# Patient Record
Sex: Female | Born: 1990 | Race: Black or African American | Hispanic: No | Marital: Single | Smoking: Never smoker
Health system: Southern US, Community
[De-identification: ages and names within clinical notes are randomized; demographics above are authoritative.]

## PROBLEM LIST (undated history)

## (undated) DIAGNOSIS — F419 Anxiety disorder, unspecified: Secondary | ICD-10-CM

## (undated) DIAGNOSIS — Z789 Other specified health status: Secondary | ICD-10-CM

## (undated) HISTORY — PX: NO PAST SURGERIES: SHX2092

---

## 2013-10-11 ENCOUNTER — Emergency Department: Payer: Medicaid Other

## 2013-10-11 ENCOUNTER — Emergency Department
Admission: EM | Admit: 2013-10-11 | Discharge: 2013-10-11 | Disposition: A | Discharge status: Home / Self Care | Payer: Medicaid Other | Attending: Emergency Medicine | Admitting: Emergency Medicine

## 2013-10-11 DIAGNOSIS — O9989 Other specified diseases and conditions complicating pregnancy, childbirth and the puerperium: Secondary | ICD-10-CM

## 2013-10-11 DIAGNOSIS — R102 Pelvic and perineal pain: Secondary | ICD-10-CM

## 2013-10-11 DIAGNOSIS — N949 Unspecified condition associated with female genital organs and menstrual cycle: Secondary | ICD-10-CM | POA: Insufficient documentation

## 2013-10-11 DIAGNOSIS — O99891 Other specified diseases and conditions complicating pregnancy: Secondary | ICD-10-CM | POA: Insufficient documentation

## 2013-10-11 LAB — URINALYSIS, REFLEX TO MICROSCOPIC EXAM IF INDICATED
Bilirubin, UA: NEGATIVE
Blood, UA: NEGATIVE
Glucose, UA: NEGATIVE
Ketones UA: 20 — AB
Nitrite, UA: NEGATIVE
Protein, UR: NEGATIVE
Specific Gravity UA: 1.019 (ref 1.001–1.035)
Urine pH: 5 (ref 5.0–8.0)
Urobilinogen, UA: NEGATIVE mg/dL

## 2013-10-11 MED ORDER — ACETAMINOPHEN 325 MG PO TABS
650.0000 mg | ORAL_TABLET | Freq: Once | ORAL | Status: AC
Start: 2013-10-11 — End: 2013-10-11
  Administered 2013-10-11: 650 mg via ORAL
  Filled 2013-10-11: qty 2

## 2013-10-11 NOTE — ED Notes (Signed)
LLQ pain that radiates to her back x 1 week pt is [redacted]weeks pregnant

## 2013-10-11 NOTE — Discharge Instructions (Addendum)
Study results to go  Activity as tolerated  No Advil/Motrin/Ibuprofen  Tylenol - 500 mg every four (4) hours for pain  Fluids-keep hydrated  Return if you are not improving or are worse    ED Medication Orders    None

## 2013-10-16 NOTE — ED Provider Notes (Addendum)
Physician/Midlevel provider first contact with patient: 10/11/13 1637         History     Chief Complaint   Patient presents with   . Abdominal Pain     HPI     Time seen: 1637 - #6    Historian: patient    Chief complaint: abdominal pain in pregnancy    HPI: the pt p/w sharp intermittent abd pain while in 2nd TM of first pregnancy; the pain   is in the LLQ and radiates to the back    Time of onset: one week ago    Severity: moderate at worst    Quality: sharp and stabbing    Improved by: nothing    Worsened by: nothing    Accompanied by: no vaginal bleeding    Complaint experienced before: no    History reviewed. No pertinent past medical history.    History reviewed. No pertinent past surgical history.    History reviewed. No pertinent family history.    Social  History   Substance Use Topics   . Smoking status: Never Smoker    . Smokeless tobacco: Not on file   . Alcohol Use: No   .   No Known Allergies    Discharge Medication List as of 10/11/2013  8:11 PM      CONTINUE these medications which have NOT CHANGED    Details   Prenatal Multivit-Min-Fe-FA (PRE-NATAL FORMULA PO) Take by mouth., Until Discontinued, Historical Med            Review of Systems   Constitutional: Negative for fever, chills and diaphoresis.   Respiratory: Negative for shortness of breath.    Cardiovascular: Negative for chest pain and leg swelling.   Gastrointestinal: Positive for abdominal pain. Negative for nausea, vomiting and diarrhea.   Endocrine: Negative for polydipsia and polyuria.   Genitourinary: Positive for pelvic pain. Negative for dysuria, frequency, hematuria, flank pain, vaginal bleeding and vaginal discharge.   Musculoskeletal: Positive for back pain.   Neurological: Negative for dizziness, syncope, weakness, light-headedness and headaches.   Psychiatric/Behavioral: Negative for confusion and decreased concentration.     Physical Exam    BP: 128/74 mmHg, Heart Rate: 84, Temp: 97.7 F (36.5 C), Resp Rate: 17, SpO2: 100 %,  Weight: 71.668 kg    Physical Exam   Constitutional: She is oriented to person, place, and time. She appears well-developed. No distress.   HENT:   Mouth/Throat: Oropharynx is clear and moist. No oropharyngeal exudate.   Neck: Normal range of motion. Neck supple.   Cardiovascular: Normal rate, regular rhythm and normal heart sounds.    Pulmonary/Chest: Effort normal and breath sounds normal. No respiratory distress. She exhibits no tenderness.   Abdominal: There is no rebound and no guarding.   Gravid abdomen with minimal tenderness in the LLQ   with exam x 2   Musculoskeletal: She exhibits no edema or tenderness.   Neurological: She is alert and oriented to person, place, and time. No cranial nerve deficit. She exhibits normal muscle tone.   Skin: Skin is warm and dry. She is not diaphoretic. No pallor.   Psychiatric: Judgment and thought content normal.   Nursing note and vitals reviewed.    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    Condition at disposition: Stable           Discharge Medication List as of 10/11/2013  8:11 PM            Terri Skains, MD  10/16/13 [phone]

## 2013-11-27 ENCOUNTER — Emergency Department
Admission: EM | Admit: 2013-11-27 | Discharge: 2013-11-27 | Disposition: A | Discharge status: Home / Self Care | Payer: Medicaid Other | Attending: Emergency Medicine | Admitting: Emergency Medicine

## 2013-11-27 ENCOUNTER — Emergency Department: Payer: Medicaid Other

## 2013-11-27 DIAGNOSIS — T148XXA Other injury of unspecified body region, initial encounter: Secondary | ICD-10-CM

## 2013-11-27 DIAGNOSIS — R079 Chest pain, unspecified: Secondary | ICD-10-CM

## 2013-11-27 DIAGNOSIS — O99891 Other specified diseases and conditions complicating pregnancy: Secondary | ICD-10-CM | POA: Insufficient documentation

## 2013-11-27 DIAGNOSIS — IMO0002 Reserved for concepts with insufficient information to code with codable children: Secondary | ICD-10-CM | POA: Insufficient documentation

## 2013-11-27 LAB — CBC AND DIFFERENTIAL
Basophils Absolute Automated: 0.02 10*3/uL (ref 0.00–0.20)
Basophils Automated: 0 %
Eosinophils Absolute Automated: 0.15 10*3/uL (ref 0.00–0.70)
Eosinophils Automated: 2 %
Hematocrit: 35.9 % — ABNORMAL LOW (ref 37.0–47.0)
Hgb: 11.8 g/dL — ABNORMAL LOW (ref 12.0–16.0)
Immature Granulocytes Absolute: 0.09 10*3/uL — ABNORMAL HIGH
Immature Granulocytes: 1 %
Lymphocytes Absolute Automated: 1.76 10*3/uL (ref 0.50–4.40)
Lymphocytes Automated: 18 %
MCH: 30.7 pg (ref 28.0–32.0)
MCHC: 32.9 g/dL (ref 32.0–36.0)
MCV: 93.5 fL (ref 80.0–100.0)
MPV: 11.6 fL (ref 9.4–12.3)
Monocytes Absolute Automated: 0.78 10*3/uL (ref 0.00–1.20)
Monocytes: 8 %
Neutrophils Absolute: 7.13 10*3/uL (ref 1.80–8.10)
Neutrophils: 72 %
Nucleated RBC: 0 /100 WBC (ref 0–1)
Platelets: 196 10*3/uL (ref 140–400)
RBC: 3.84 10*6/uL — ABNORMAL LOW (ref 4.20–5.40)
RDW: 13 % (ref 12–15)
WBC: 9.84 10*3/uL (ref 3.50–10.80)

## 2013-11-27 LAB — URINALYSIS, REFLEX TO MICROSCOPIC EXAM IF INDICATED
Bilirubin, UA: NEGATIVE
Blood, UA: NEGATIVE
Glucose, UA: NEGATIVE
Ketones UA: NEGATIVE
Nitrite, UA: NEGATIVE
Protein, UR: 30 — AB
Specific Gravity UA: 1.005 (ref 1.001–1.035)
Urine pH: 7 (ref 5.0–8.0)
Urobilinogen, UA: NORMAL mg/dL

## 2013-11-27 LAB — COMPREHENSIVE METABOLIC PANEL
ALT: 38 U/L (ref 0–55)
AST (SGOT): 26 U/L (ref 5–34)
Albumin/Globulin Ratio: 0.9 (ref 0.9–2.2)
Albumin: 3 g/dL — ABNORMAL LOW (ref 3.5–5.0)
Alkaline Phosphatase: 60 U/L (ref 37–106)
BUN: 11 mg/dL (ref 7–19)
Bilirubin, Total: 0.2 mg/dL (ref 0.2–1.2)
CO2: 23 mEq/L (ref 22–29)
Calcium: 9 mg/dL (ref 8.5–10.5)
Chloride: 109 mEq/L (ref 100–111)
Creatinine: 0.6 mg/dL (ref 0.6–1.0)
Globulin: 3.3 g/dL (ref 2.0–3.6)
Glucose: 86 mg/dL (ref 70–100)
Potassium: 3.7 mEq/L (ref 3.5–5.1)
Protein, Total: 6.3 g/dL (ref 6.0–8.3)
Sodium: 141 mEq/L (ref 136–145)

## 2013-11-27 LAB — IHS D-DIMER: D-Dimer: 0.98 ug/mL FEU — ABNORMAL HIGH (ref 0.00–0.51)

## 2013-11-27 LAB — GFR: EGFR: >60

## 2013-11-27 MED ORDER — IODIXANOL 320 MG/ML IV SOLN
INTRAVENOUS | Status: AC
Start: 2013-11-27 — End: 2013-11-27
  Administered 2013-11-27: 100 mL via INTRAVENOUS
  Filled 2013-11-27: qty 100

## 2013-11-27 MED ORDER — ACETAMINOPHEN 325 MG PO TABS
650.0000 mg | ORAL_TABLET | ORAL | Status: AC | PRN
Start: 2013-11-27 — End: 2013-12-07

## 2013-11-27 MED ORDER — ACETAMINOPHEN 325 MG PO TABS
650.0000 mg | ORAL_TABLET | Freq: Once | ORAL | Status: AC
Start: 2013-11-27 — End: 2013-11-27
  Administered 2013-11-27: 650 mg via ORAL
  Filled 2013-11-27: qty 2

## 2013-11-27 NOTE — ED Provider Notes (Signed)
Physician/Midlevel provider first contact with patient: 11/27/13 0910         History     Chief Complaint   Patient presents with   . Chest Pain     Patient is a 23 y.o. female presenting with chest pain. The history is provided by the patient and medical records. No language interpreter was used.   Chest Pain  Pain location:  L chest  Pain quality: sharp    Pain radiates to:  Mid back  Pain severity:  Moderate  Onset quality:  Sudden  Duration: last evening at rest.  Timing:  Constant  Progression:  Unchanged  Chronicity:  New  Context: breathing, movement and at rest    Relieved by:  Certain positions and rest  Worsened by:  Movement, deep breathing and certain positions  Ineffective treatments:  None tried  Associated symptoms: back pain and shortness of breath    Associated symptoms: no abdominal pain, no altered mental status, no cough, no diaphoresis, no dizziness, no fatigue, no fever, no lower extremity edema, no nausea, no numbness, no syncope and not vomiting     pt is about 5 months pregnant.  No pmhx or family hx for cad or pe.  Non smoker.     History reviewed. No pertinent past medical history.    History reviewed. No pertinent past surgical history.    History reviewed. No pertinent family history.    Social  History   Substance Use Topics   . Smoking status: Never Smoker    . Smokeless tobacco: Not on file   . Alcohol Use: No       .     No Known Allergies    Discharge Medication List as of 11/27/2013  3:37 PM      CONTINUE these medications which have NOT CHANGED    Details   Prenatal Multivit-Min-Fe-FA (PRE-NATAL FORMULA PO) Take by mouth., Until Discontinued, Historical Med              Review of Systems   Constitutional: Negative for fever, diaphoresis and fatigue.   Respiratory: Positive for shortness of breath. Negative for cough.    Cardiovascular: Positive for chest pain. Negative for syncope.   Gastrointestinal: Negative for nausea, vomiting and abdominal pain.   Musculoskeletal: Positive  for back pain.   Neurological: Negative for dizziness and numbness.       Physical Exam    BP: 125/71 mmHg, Heart Rate: 94, Temp: 97.7 F (36.5 C), Resp Rate: 17, SpO2: 98 %, Weight: 74.844 kg    Physical Exam  Nursing note and vitals reviewed.  Constitutional:  Well developed, well nourished. Awake & Oriented x3.  Head:  Atraumatic. Normocephalic.    Eyes:  PERRL. EOMI. Conjunctivae are not pale.  ENT:  Mucous membranes are moist and intact. Oropharynx is clear and symmetric.  Patent airway.  Neck:  Supple. Full ROM.    Cardiovascular:  Regular rate. Regular rhythm. No murmurs, rubs, or gallops.  Pulmonary/Chest:  No evidence of respiratory distress. Clear to auscultation bilaterally.  No wheezing, rales or rhonchi. Very tender on palpation left chest wall just below breast.  Abdominal:  Soft and non-distended. There is no tenderness. No rebound, guarding, or rigidity.  Back:  Full ROM. Tender left mid lateral left back.  Extremities:  No edema. No cyanosis. No clubbing. Full range of motion in all extremities.  Skin:  Skin is warm and dry.  No diaphoresis. No rash.   Neurological:  Alert, awake,  and appropriate. Normal speech. Motor normal.  Psychiatric:  Good eye contact. Normal interaction, affect, and behavior.    Pox 98 ra normal no tx needed.  MDM and ED Course     ED Medication Orders     Start     Status Ordering Provider    11/27/13 1435  iodixanol (VISIPAQUE) 320 MG/ML injection     Comments:  Created by cabinet override    Last MAR action:  Given     11/27/13 1042  acetaminophen (TYLENOL) tablet 650 mg   Once     Route: Oral  Ordered Dose: 650 mg     Last MAR action:  Given Markey Deady C           MDM  ekg read by me nsr 87.  Normal axis and intervals.  No stt changes.  Normal ekg.    I feel sx likely muscular but given pt pregnant risk factor for PE so will check d-dimer first.  D-dimer was high so ct done normal.    I spoke to Dr. Tenna Delaine who is ok with [facility].     Procedures    Clinical Impression & Disposition     Clinical Impression  Final diagnoses:   Muscle strain        ED Disposition     Discharge Ayauna Vanterpool discharge to home/self care.    Condition at disposition: Stable             Discharge Medication List as of 11/27/2013  3:37 PM      START taking these medications    Details   acetaminophen (TYLENOL) 325 MG tablet Take 2 tablets (650 mg total) by mouth every 4 (four) hours as needed for Pain., Starting 11/27/2013, Until Mon 12/07/13, Print                       Kennith Maes, MD  11/27/13 2041

## 2013-11-27 NOTE — ED Notes (Signed)
pt c/o leftside chest pain under left breast, started yesterday; pt describes pain as sharp and hurts went she takes a deep breath; pt c/o SOB since yesterday; pt states she is 25 weeks pregnan

## 2013-11-27 NOTE — Discharge Instructions (Signed)
Muscle Strain, General    You have been diagnosed with a muscle strain.    Any muscle in the body can be strained. A strain is an injury to muscles where some muscle fibers are injured by being stretched or partly torn. This usually happens from using the muscle too much or from doing an activity the muscle is not used to.    Some strain symptoms are pain, muscle cramping and soreness to the touch.    Often, muscle pain and stiffness are worse the next day. This is much like what happens when someone starts exercising for the first time. After exercising, the person may feel pretty good. However, the next day all the exercised muscles are stiff and sore.    General strain treatment includes:   Resting the affected part.   Pain medicine.   Muscle relaxant medicines.   Warm compresses (such as a warm, moist towel).   Gently stretching the injured muscle.   When tolerated, gently massaging the injured area.    This injury is self-limited (it gets better on its own). It rarely needs specific treatment.    YOU SHOULD SEEK MEDICAL ATTENTION IMMEDIATELY, EITHER HERE OR AT THE NEAREST EMERGENCY DEPARTMENT, IF ANY OF THE FOLLOWING OCCURS:   Major increase in swelling of the affected area.   Pain gets worse instead of gradually improving.   Skin gets red over the affected area.   Unable to use the affected limb. Limb weakness or numbness.              Chest Pain of Unclear Etiology    You have been seen for chest pain. The cause of your pain is not yet known.    Your doctor has learned about your medical history, examined you, and checked any tests that were done. Still, it is unclear why you are having pain. The doctor thinks there is only a very small chance that your pain is caused by a life-threatening condition. Later, your primary care doctor might do more tests or check you again.    Sometimes chest pain is caused by a dangerous condition, like a heart attack, aorta injury, blood clot in the lung,  or collapsed lung. It is unlikely that your pain is caused by a life-threatening condition if: Your chest pain lasts only a few seconds at a time; you are not short of breath, nauseated (sick to your stomach), sweaty, or lightheaded; your pain gets worse when you twist or bend; your pain improves with exercise or hard work.    Chest pain is serious. It is VERY IMPORTANT that you follow up with your regular doctor and seek medical attention immediately here or at the nearest Emergency Department if your symptoms become worse or they change.    YOU SHOULD SEEK MEDICAL ATTENTION IMMEDIATELY, EITHER HERE OR AT THE NEAREST EMERGENCY DEPARTMENT, IF ANY OF THE FOLLOWING OCCURS:   Your pain gets worse.   Your pain makes you short of breath, nauseated, or sweaty.   Your pain gets worse when you walk, go up stairs, or exert yourself.   You feel weak, lightheaded, or faint.   It hurts to breathe.   Your leg swells.   Your symptoms get worse or you have new symptoms or concerns.

## 2013-11-27 NOTE — ED Notes (Signed)
[id number]

## 2013-11-30 LAB — ECG 12-LEAD
Atrial Rate: 87 {beats}/min
P Axis: 35 degrees
P-R Interval: 152 ms
Q-T Interval: 362 ms
QRS Duration: 76 ms
QTC Calculation (Bezet): 435 ms
R Axis: 57 degrees
T Axis: 39 degrees
Ventricular Rate: 87 {beats}/min

## 2014-08-25 ENCOUNTER — Emergency Department: Payer: Self-pay

## 2014-08-25 ENCOUNTER — Emergency Department
Admission: EM | Admit: 2014-08-25 | Discharge: 2014-08-25 | Disposition: A | Discharge status: Home / Self Care | Payer: Self-pay | Attending: Emergency Medicine | Admitting: Emergency Medicine

## 2014-08-25 DIAGNOSIS — R0789 Other chest pain: Secondary | ICD-10-CM | POA: Insufficient documentation

## 2014-08-25 LAB — CBC AND DIFFERENTIAL
Basophils Absolute Automated: 0.02 10*3/uL (ref 0.00–0.20)
Basophils Automated: 0 %
Eosinophils Absolute Automated: 0.13 10*3/uL (ref 0.00–0.70)
Eosinophils Automated: 2 %
Hematocrit: 40.7 % (ref 37.0–47.0)
Hgb: 13.5 g/dL (ref 12.0–16.0)
Lymphocytes Absolute Automated: 1.87 10*3/uL (ref 0.50–4.40)
Lymphocytes Automated: 26 %
MCH: 31 pg (ref 28.0–32.0)
MCHC: 33.2 g/dL (ref 32.0–36.0)
MCV: 93.3 fL (ref 80.0–100.0)
MPV: 11.7 fL (ref 9.4–12.3)
Monocytes Absolute Automated: 0.56 10*3/uL (ref 0.00–1.20)
Monocytes: 8 %
Neutrophils Absolute: 4.69 10*3/uL (ref 1.80–8.10)
Neutrophils: 64 %
Platelets: 243 10*3/uL (ref 140–400)
RBC: 4.36 10*6/uL (ref 4.20–5.40)
RDW: 11 % — ABNORMAL LOW (ref 12–15)
WBC: 7.27 10*3/uL (ref 3.50–10.80)

## 2014-08-25 LAB — BASIC METABOLIC PANEL
Anion Gap: 10 (ref 5.0–15.0)
BUN: 12 mg/dL (ref 7.0–19.0)
CO2: 28 mEq/L (ref 22–29)
Calcium: 9.9 mg/dL (ref 8.5–10.5)
Chloride: 104 mEq/L (ref 100–111)
Creatinine: 0.8 mg/dL (ref 0.6–1.0)
Glucose: 97 mg/dL (ref 70–100)
Potassium: 3.8 mEq/L (ref 3.5–5.1)
Sodium: 142 mEq/L (ref 136–145)

## 2014-08-25 LAB — GFR: EGFR: >60

## 2014-08-25 LAB — IHS D-DIMER: D-Dimer: 0.3 ug/mL FEU (ref 0.00–0.51)

## 2014-08-25 MED ORDER — KETOROLAC TROMETHAMINE 30 MG/ML IJ SOLN
30.0000 mg | Freq: Once | INTRAMUSCULAR | Status: AC
Start: 2014-08-25 — End: 2014-08-25
  Administered 2014-08-25: 30 mg via INTRAVENOUS
  Filled 2014-08-25: qty 2

## 2014-08-25 MED ORDER — IBUPROFEN 600 MG PO TABS
600.0000 mg | ORAL_TABLET | Freq: Four times a day (QID) | ORAL | Status: DC | PRN
Start: 2014-08-25 — End: 2014-11-01

## 2014-08-25 MED ORDER — SODIUM CHLORIDE 0.9 % IV BOLUS
1000.0000 mL | Freq: Once | INTRAVENOUS | Status: AC
Start: 2014-08-25 — End: 2014-08-25
  Administered 2014-08-25: 1000 mL via INTRAVENOUS

## 2014-08-25 NOTE — ED Provider Notes (Signed)
Physician/Midlevel provider first contact with patient: 08/25/14 1927           EMERGENCY DEPARTMENT NOTE    Physician/Midlevel provider first contact with patient: 08/25/14 1927         HISTORY OF PRESENT ILLNESS   Historian: Patient  Translator Used: None    24 y.o. female presents with cp for 1 week with mild dyspnea at night. Denies cough, congestion, f/c, abdominal pain. Denies FH of CAD at a young age. No known PMH including asthma. Patient is s/p vaginal delivery in Nov. No LE edema, prior pe/dvt    1. Location of symptoms: Chest   2. Onset of symptoms: 1 week   3. What was patient doing when symptoms started (Context): At rest   4. Severity: Mild  5. Timing: Intermittent  6. Activities that worsen symptoms: [place]  7. Activities that improve symptoms: Nothing taken   8. Quality: Ache    9. Radiation of symptoms: None   10. Associated signs and Symptoms: None    11. Are symptoms worsening? Unchanged          MEDICAL HISTORY     Past Medical History:  History reviewed. No pertinent past medical history.    Past Surgical History:  History reviewed. No pertinent past surgical history.    Social History:  History     Social History   . Marital Status: Single     Spouse Name: N/A   . Number of Children: N/A   . Years of Education: N/A     Occupational History   . Not on file.     Social History Main Topics   . Smoking status: Never Smoker    . Smokeless tobacco: Not on file   . Alcohol Use: No   . Drug Use: No   . Sexual Activity: Not on file     Other Topics Concern   . Not on file     Social History Narrative       Family History:  History reviewed. No pertinent family history.    Outpatient Medication:  Discharge Medication List as of 08/25/2014  8:54 PM      CONTINUE these medications which have NOT CHANGED    Details   Prenatal Multivit-Min-Fe-FA (PRE-NATAL FORMULA PO) Take by mouth., Until Discontinued, Historical Med             Allergies:  No Known Allergies      REVIEW OF SYSTEMS   Review of Systems    Constitutional: Negative for fever and chills.   Respiratory: Negative for cough and wheezing.    Cardiovascular: Positive for chest pain.   All other systems reviewed and are negative.        PHYSICAL EXAM     Filed Vitals:    08/25/14 1925 08/25/14 1943 08/25/14 2042 08/25/14 2103   BP:  118/75 113/84 108/63   Pulse:  80 74 74   Resp:  18 18 20    Height: 5\' 10"  (1.778 m)      Weight: 77.111 kg      SpO2:  98% 98% 98%       Nursing note and vitals reviewed.  Constitutional:  Well developed, well nourished. Awake & Oriented x3.  Head:  Atraumatic. Normocephalic.    Eyes:  PERRL. EOMI. Conjunctivae are not pale.  ENT:  Mucous membranes are moist and intact. Oropharynx is clear and symmetric.  Patent airway.  Neck:  Supple. Full ROM.    Cardiovascular:  Regular rate. Regular rhythm. No murmurs, rubs, or gallops.  Pulmonary/Chest:  No evidence of respiratory distress. Clear to auscultation bilaterally.  No wheezing, rales or rhonchi. Nontender anterior chest wall  Abdominal:  Soft and non-distended. There is no tenderness. No rebound, guarding, or rigidity.  Extremities:  No edema. No cyanosis. No clubbing. Full range of motion in all extremities.  Skin:  Skin is warm and dry.  No diaphoresis. No rash.   Neurological:  Alert, awake, and appropriate. Normal speech. Motor normal.  Psychiatric:  Good eye contact. Normal interaction, affect, and behavior.        MEDICAL DECISION MAKING       EKG is without acute changes. No arrhythmia seen on ekg/cardaic monitor. CXR negative  Dimer negative, no le edema. Pt is a TIMI 0 without cardiac risk factors or FH of CAD at a young age  Likely atypical pain. Advised to follow up with her primary care provider    DISCUSSION      Vital Signs: Reviewed the patient?s vital signs.   Nursing Notes: Reviewed and utilized available nursing notes.  Medical Records Reviewed: Reviewed available past medical records.  Counseling: The emergency provider has spoken with the patient and  discussed today?s findings, in addition to providing specific details for the plan of care.  Questions are answered and there is agreement with the plan.    IMAGING STUDIES    The following imaging studies were independently interpreted by the Emergency Medicine Physician.  For full imaging study results please see chart.        Imaging Results          Chest 2 Views (Final result)     Result time: 08/25/14 20:05:33     Final result by Rachelle Hora, MD (08/25/14 20:05:33)    Impression:       NO SIGNIFICANT ABNORMALITY.    Darnelle Maffucci, MD   08/25/2014 8:05 PM       Narrative:     XR CHEST 2 VIEWS    CLINICAL INDICATION: Chest pain     COMPARISON: None    TECHNIQUE: The following radiographs were obtained per protocol:  XR  CHEST 2 VIEWS    FINDINGS: Cardiomediastinal silhouette is within normal limits. No  alveolar infiltrate. No effusion. No active disease. The osseous  structures are unremarkable. No acute abnormalities are apparent.           CARDIAC STUDIES     The following cardiac studies were independently interpreted by the Emergency Medicine Physician. For full cardiac study results please see chart     Monitor Strip   Interpreted by ED Physician   Rate: 84  Rhythm: SR  ST Changes: None    EKG Interpretation:   Signed and interpreted by ED Physician   Time Interpreted: 1926  Comparison: 11/2013  Rate: 83  Rhythm: SR  Axis: Normal  Intervals: Normal   Blocks: None  ST segments: No acute changes.   Interpretation: Normal EKG      PULSE OXIMETRY    Oxygen Saturation by Pulse Oximetry: 98%  Interventions:   Interpretation: Normal    EMERGENCY DEPT. MEDICATIONS      ED Medication Orders     Start Ordered     Status Ordering Provider    08/25/14 2030 08/25/14 2029  sodium chloride 0.9 % bolus 1,000 mL   Once     Route: Intravenous  Ordered Dose: 1,000 mL     Last MAR action:  Stopped Raliegh Ip    08/25/14 2030 08/25/14 2029  ketorolac (TORADOL) injection 30 mg    Once     Route: Intravenous  Ordered Dose: 30 mg     Last MAR action:  Given Macarthur Lorusso YVONNE          LABORATORY RESULTS    Ordered and independently interpreted AVAILABLE laboratory tests. Please see results section in chart for full details.  Results for orders placed or performed during the hospital encounter of 08/25/14   CBC with Differential   Result Value Ref Range    WBC 7.27 3.50 - 10.80 x10 3/uL    Hgb 13.5 12.0 - 16.0 g/dL    Hematocrit 72.5 36.6 - 47.0 %    Platelets 243 140 - 400 x10 3/uL    RBC 4.36 4.20 - 5.40 x10 6/uL    MCV 93.3 80.0 - 100.0 fL    MCH 31.0 28.0 - 32.0 pg    MCHC 33.2 32.0 - 36.0 g/dL    RDW 11 (L) 12 - 15 %    MPV 11.7 9.4 - 12.3 fL    Neutrophils 64 None %    Lymphocytes Automated 26 None %    Monocytes 8 None %    Eosinophils Automated 2 None %    Basophils Automated 0 None %    Immature Granulocyte Unmeasured None %    Nucleated RBC Unmeasured 0 - 1 /100 WBC    Neutrophils Absolute 4.69 1.80 - 8.10 x10 3/uL    Abs Lymph Automated 1.87 0.50 - 4.40 x10 3/uL    Abs Mono Automated 0.56 0.00 - 1.20 x10 3/uL    Abs Eos Automated 0.13 0.00 - 0.70 x10 3/uL    Absolute Baso Automated 0.02 0.00 - 0.20 x10 3/uL    Absolute Immature Granulocyte Unmeasured 0 x10 3/uL   Basic Metabolic Panel (BMP)   Result Value Ref Range    Glucose 97 70 - 100 mg/dL    BUN 44.0 7.0 - 34.7 mg/dL    Creatinine 0.8 0.6 - 1.0 mg/dL    CALCIUM 9.9 8.5 - 42.5 mg/dL    Sodium 956 387 - 564 mEq/L    Potassium 3.8 3.5 - 5.1 mEq/L    Chloride 104 100 - 111 mEq/L    CO2 28 22 - 29 mEq/L    Anion Gap 10.0 5.0 - 15.0   D-Dimer   Result Value Ref Range    D-Dimer 0.30 0.00 - 0.51 ug/mL FEU   GFR   Result Value Ref Range    EGFR >60.0    ECG 12 lead   Result Value Ref Range    Ventricular Rate 83 BPM    Atrial Rate 83 BPM    P-R Interval 156 ms    QRS Duration 86 ms    Q-T Interval 352 ms    QTC Calculation (Bezet) 413 ms    P Axis 34 degrees    R Axis 68 degrees    T Axis 54 degrees       CONSULTATIONS         CRITICAL CARE        ATTESTATIONS        Physician Attestation: I, Dr Marnee Spring DO, have been the primary provider for Carlyon Prows during this Emergency Dept visit and have reviewed the chart for accuracy and agree with its content.       DIAGNOSIS      Diagnosis:  Final diagnoses:  Atypical chest pain       Disposition:  ED Disposition     Discharge Simrit Lukens discharge to home/self care.    Condition at disposition: Stable            Prescriptions:  Discharge Medication List as of 08/25/2014  8:54 PM      START taking these medications    Details   ibuprofen (ADVIL,MOTRIN) 600 MG tablet Take 1 tablet (600 mg total) by mouth every 6 (six) hours as needed for Pain or Fever., Starting 08/25/2014, Until Discontinued, Print         CONTINUE these medications which have NOT CHANGED    Details   Prenatal Multivit-Min-Fe-FA (PRE-NATAL FORMULA PO) Take by mouth., Until Discontinued, Historical Med               Raliegh Ip, DO  08/26/14 [phone]

## 2014-08-25 NOTE — ED Notes (Signed)
Chest pain onset 3 days ago, sharp and constant + nausea, denies vomiting + sob, pain is non- radiating. Denies respiratory sx

## 2014-08-25 NOTE — Discharge Instructions (Signed)
Musculoskeletal Chest Pain    You have been diagnosed with musculoskeletal chest pain.    Your pain is due to an injury or inflammation (swelling) of the muscles, ligaments, cartilage (soft bone), or bone in your chest. The pain is usually sharp and knife-like and becomes worse with twisting, bending, or moving. It commonly occurs in a small area, and can be irritated by pressing on it. There is usually no shortness of breath, lightheadedness, weakness, or sweaty feeling. Some children will have pain when taking a deep breath or when coughing. Exercise usually does not affect these symptoms.    Musculoskeletal chest pain is treated with anti-inflammatory medications like ibuprofen (Advil or Motrin) or naproxen (Aleve). Other pain medications are usually not needed. Depending on the reason for your symptoms, either warm or cool compresses (damp washcloths laid on the skin) may be helpful.    Most musculoskeletal chest pain improves over several days.    You do not need to follow up with a doctor unless your symptoms get worse or fail to improve in the next few days.    YOU SHOULD SEEK MEDICAL ATTENTION IMMEDIATELY, EITHER HERE OR AT THE NEAREST EMERGENCY DEPARTMENT, IF ANY OF THE FOLLOWING OCCURS:   Your pain gets worse.   Your pain makes you feel short of breath, nauseated, or sweaty.   You notice that your pain gets worse as you walk, go up stairs, or exert yourself.   You have any weakness or lightheadedness with your pain.   Your pain makes breathing difficult.   You develop a swollen leg.   Your symptoms get worse or you have other concerns.

## 2014-08-27 LAB — ECG 12-LEAD
Atrial Rate: 83 {beats}/min
P Axis: 34 degrees
P-R Interval: 156 ms
Q-T Interval: 352 ms
QRS Duration: 86 ms
QTC Calculation (Bezet): 413 ms
R Axis: 68 degrees
T Axis: 54 degrees
Ventricular Rate: 83 {beats}/min

## 2014-10-08 ENCOUNTER — Emergency Department: Payer: Self-pay

## 2014-10-08 ENCOUNTER — Emergency Department
Admission: EM | Admit: 2014-10-08 | Discharge: 2014-10-08 | Disposition: A | Discharge status: Home / Self Care | Payer: Self-pay | Attending: Student in an Organized Health Care Education/Training Program | Admitting: Student in an Organized Health Care Education/Training Program

## 2014-10-08 DIAGNOSIS — K029 Dental caries, unspecified: Secondary | ICD-10-CM | POA: Insufficient documentation

## 2014-10-08 DIAGNOSIS — K0889 Other specified disorders of teeth and supporting structures: Secondary | ICD-10-CM

## 2014-10-08 MED ORDER — PENICILLIN V POTASSIUM 250 MG PO TABS
500.0000 mg | ORAL_TABLET | Freq: Once | ORAL | Status: AC
Start: 2014-10-08 — End: 2014-10-08
  Administered 2014-10-08: 500 mg via ORAL
  Filled 2014-10-08: qty 2

## 2014-10-08 MED ORDER — OXYCODONE HCL 5 MG PO TABS
ORAL_TABLET | ORAL | Status: DC
Start: 2014-10-08 — End: 2014-11-01

## 2014-10-08 MED ORDER — PENICILLIN V POTASSIUM 500 MG PO TABS
500.0000 mg | ORAL_TABLET | Freq: Four times a day (QID) | ORAL | Status: AC
Start: 2014-10-08 — End: 2014-10-15

## 2014-10-08 MED ORDER — OXYCODONE HCL 5 MG PO TABS
5.0000 mg | ORAL_TABLET | Freq: Once | ORAL | Status: AC
Start: 2014-10-08 — End: 2014-10-08
  Administered 2014-10-08: 5 mg via ORAL
  Filled 2014-10-08: qty 1

## 2014-10-08 NOTE — Discharge Instructions (Signed)
Please continue to closely monitor your symptoms  Take motrin/tylenol as directed as needed for pain  Take keflex as directed  Take oxycodone as directed as needed for severe breakthrough pain. No driving/heavy machinery while on oxycodone. Please pump and dump your breast milk while taking oxycodone since it can potentially be harmful to your baby  Return to the ER if worsening pain/swelling/difficulty breathing/fever/chills or any other worsening concerns  Please follow up with dentist    Thank you for allowing [place] to address your concerns. Hope you feel better soon!  Sharon Seller M.D.  Emergency Medicine    Dental Caries    You have been seen for dental caries or cavities.    Dental caries are common. They are caused by loss of tooth enamel and decay of the tooth.    You may feel pain when drinking hot liquids, eating sweets, or chewing.    You will be provided with pain medication. Later, you should follow up with a dentist. To help prevent more dental caries, it is important to practice good dental care. This includes brushing your teeth often, using dental floss, and seeing your dentist regularly.    It is VERY IMPORTANT to follow up with a dentist. We recommend you see a dentist within 2 to 3 days.    YOU SHOULD BE SEEN BY A DENTIST, OR RETURN HERE OR GO TO THE NEAREST EMERGENCY DEPARTMENT IMMEDIATELY IF ANY OF THE FOLLOWING OCCURS:   Your pain gets worse.   You develop swelling of your face, under your tongue, or in your throat.   You develop fever (temperature higher than 100.44F / 38C) with dental pain.    Emergency and Urgent Care physicians are not dentists. Emergency department or urgent care treatment IS NOT A SUBSTITUTE for treatment by a dentist. You need to follow up with your dentist immediately and arrange to see a dentist on a regular basis.

## 2014-10-08 NOTE — ED Notes (Signed)
Ambulatory alert orientedx4 c/o of toothache for 2 weeks.

## 2014-10-08 NOTE — ED Provider Notes (Signed)
EMERGENCY DEPARTMENT NOTE    Physician/Midlevel provider first contact with patient: 10/08/14 0318         HISTORY OF PRESENT ILLNESS   Historian:Patient  Translator Used: No    Chief Complaint: Dental Pain      24 y.o. female p/w dental pain. Pt reports pain on lower and upper R molars x ~2 weeks. Pain worse on upper molar. No fever/chills/hearing loss/tinnitus/lip or tongue swelling/SOB/facial swelling      MEDICAL HISTORY     Past Medical History:  History reviewed. No pertinent past medical history.    Past Surgical History:  History reviewed. No pertinent past surgical history.    Social History:  History     Social History   . Marital Status: Single     Spouse Name: N/A   . Number of Children: N/A   . Years of Education: N/A     Occupational History   . Not on file.     Social History Main Topics   . Smoking status: Never Smoker    . Smokeless tobacco: Not on file   . Alcohol Use: No   . Drug Use: No   . Sexual Activity: Not on file     Other Topics Concern   . Not on file     Social History Narrative       Family History:  History reviewed. No pertinent family history.    Outpatient Medication:  Discharge Medication List as of 10/08/2014  3:32 AM      CONTINUE these medications which have NOT CHANGED    Details   acetaminophen (TYLENOL) 500 MG tablet Take 1,000 mg by mouth.   , Until Discontinued, Historical Med      ibuprofen (ADVIL,MOTRIN) 600 MG tablet Take 1 tablet (600 mg total) by mouth every 6 (six) hours as needed for Pain or Fever., Starting 08/25/2014, Until Discontinued, Print               REVIEW OF SYSTEMS     Review of Systems: Review of system otherwise Negative except for noted below;  CONSTITUTIONAL: No fever/chills  HEENT: toothache          PHYSICAL EXAM   BP 137/86 mmHg  Pulse 70  Temp(Src) 98.4 F (36.9 C) (Oral)  Resp 18  Ht 5\' 7"  (1.702 m)  Wt 78.019 kg  BMI 26.93 kg/m2  SpO2 99%  LMP 10/03/2014  Physical Exam:  GEN: NAD, appears well  HEENT: Dental carries, +TTP tooth #2-3. No  drainable abscess. No drainage/TTP parotids. TM clear b/l. No TTP mastoids. No lip or tongue swelling. No tonsillar erythema/edema/exudate. Midline uvula. MMM. No halitosis  NECK: soft/supple  NEURO: CN II-XII intactrash        MEDICAL DECISION MAKING       Vital Signs: Reviewed the patient?s vital signs.   Nursing Notes: Reviewed and utilized available nursing notes.  Medical Records Reviewed: Reviewed available past medical records.  Counseling: The emergency provider has spoken with the patient and discussed today?s findings, in addition to providing specific details for the plan of care.  Questions are answered and there is agreement with the plan.            PULSE OXIMETRY    Oxygen Saturation by Pulse Oximetry: 99%  Interventions: none  Interpretation:  normal    EMERGENCY DEPT. MEDICATIONS      ED Medication Orders     Start Ordered     Status Ordering Provider  10/08/14 0330 10/08/14 0329  oxyCODONE (ROXICODONE) immediate release tablet 5 mg   Once     Route: Oral  Ordered Dose: 5 mg     Last MAR action:  Given Kary Sugrue    10/08/14 0330 10/08/14 0329  penicillin v potassium (VEETID) tablet 500 mg   Once     Route: Oral  Ordered Dose: 500 mg     Last MAR action:  Given Johnathin Vanderschaaf                DISCUSSION/ED COURSE    24 y.o. female with dental caries/pain as noted above. Pt overall appearing well. Will tx with PCN VK, motrin/tylelenol, oxycodone PRN severe breakthrough pain. Pt states she will pump and dump breast milk to be completely safe.   The pt is stable for discharge, counseling is provided as documented below, discussed symptomatic treatment and specific conditions for return.  Pt will closely monitor symptoms and followup. Will return immediately is any worsening symptoms/concerns.       DIAGNOSIS      Diagnosis:  Final diagnoses:   Dental caries   Pain, dental       Disposition:  ED Disposition     Discharge Brenda Rothgeb discharge to home/self care.    Condition at disposition:  Stable            Prescriptions:  Discharge Medication List as of 10/08/2014  3:32 AM      START taking these medications    Details   oxyCODONE (ROXICODONE) 5 MG immediate release tablet 1-2 tablets every 6-8 hours as needed for pain, Print      penicillin v potassium (VEETID) 500 MG tablet Take 1 tablet (500 mg total) by mouth 4 (four) times daily., Starting 10/08/2014, Until Fri 10/15/14, Print         CONTINUE these medications which have NOT CHANGED    Details   acetaminophen (TYLENOL) 500 MG tablet Take 1,000 mg by mouth.   , Until Discontinued, Historical Med      ibuprofen (ADVIL,MOTRIN) 600 MG tablet Take 1 tablet (600 mg total) by mouth every 6 (six) hours as needed for Pain or Fever., Starting 08/25/2014, Until Discontinued, Print                       Sharon Seller, MD  10/10/14 2155

## 2014-11-01 ENCOUNTER — Emergency Department: Payer: Self-pay

## 2014-11-01 ENCOUNTER — Emergency Department
Admission: EM | Admit: 2014-11-01 | Discharge: 2014-11-01 | Disposition: A | Discharge status: Home / Self Care | Payer: Self-pay | Attending: Emergency Medicine | Admitting: Emergency Medicine

## 2014-11-01 DIAGNOSIS — R51 Headache: Secondary | ICD-10-CM | POA: Insufficient documentation

## 2014-11-01 DIAGNOSIS — R519 Headache, unspecified: Secondary | ICD-10-CM

## 2014-11-01 LAB — COMPREHENSIVE METABOLIC PANEL
ALT: 17 U/L (ref 0–55)
AST (SGOT): 14 U/L (ref 5–34)
Albumin/Globulin Ratio: 1.4 (ref 0.9–2.2)
Albumin: 4.1 g/dL (ref 3.5–5.0)
Alkaline Phosphatase: 84 U/L (ref 37–106)
Anion Gap: 11 (ref 5.0–15.0)
BUN: 17 mg/dL (ref 7–19)
Bilirubin, Total: 0.1 mg/dL — ABNORMAL LOW (ref 0.2–1.2)
CO2: 27 mEq/L (ref 22–29)
Calcium: 10 mg/dL (ref 8.5–10.5)
Chloride: 104 mEq/L (ref 100–111)
Creatinine: 0.8 mg/dL (ref 0.6–1.0)
Globulin: 2.9 g/dL (ref 2.0–3.6)
Glucose: 88 mg/dL (ref 70–100)
Potassium: 4 mEq/L (ref 3.5–5.1)
Protein, Total: 7 g/dL (ref 6.0–8.3)
Sodium: 142 mEq/L (ref 136–145)

## 2014-11-01 LAB — CBC AND DIFFERENTIAL
Basophils Absolute Automated: 0.02 10*3/uL (ref 0.00–0.20)
Basophils Automated: 0 %
Eosinophils Absolute Automated: 0.15 10*3/uL (ref 0.00–0.70)
Eosinophils Automated: 2 %
Hematocrit: 41.2 % (ref 37.0–47.0)
Hgb: 13.5 g/dL (ref 12.0–16.0)
Immature Granulocytes Absolute: 0.01 10*3/uL
Immature Granulocytes: 0 %
Lymphocytes Absolute Automated: 2.15 10*3/uL (ref 0.50–4.40)
Lymphocytes Automated: 29 %
MCH: 30.3 pg (ref 28.0–32.0)
MCHC: 32.8 g/dL (ref 32.0–36.0)
MCV: 92.6 fL (ref 80.0–100.0)
MPV: 11 fL (ref 9.4–12.3)
Monocytes Absolute Automated: 0.57 10*3/uL (ref 0.00–1.20)
Monocytes: 8 %
Neutrophils Absolute: 4.51 10*3/uL (ref 1.80–8.10)
Neutrophils: 61 %
Nucleated RBC: 0 /100 WBC (ref 0–1)
Platelets: 239 10*3/uL (ref 140–400)
RBC: 4.45 10*6/uL (ref 4.20–5.40)
RDW: 12 % (ref 12–15)
WBC: 7.4 10*3/uL (ref 3.50–10.80)

## 2014-11-01 LAB — GFR: EGFR: >60

## 2014-11-01 LAB — URINE HCG QUALITATIVE: Urine HCG Qualitative: NEGATIVE

## 2014-11-01 MED ORDER — MAGNESIUM SULFATE IN D5W 10-5 MG/ML-% IV SOLN
1.0000 g | Freq: Once | INTRAVENOUS | Status: AC
Start: 2014-11-01 — End: 2014-11-01
  Administered 2014-11-01: 1 g via INTRAVENOUS
  Filled 2014-11-01: qty 100

## 2014-11-01 MED ORDER — METOCLOPRAMIDE HCL 5 MG/ML IJ SOLN
10.0000 mg | Freq: Once | INTRAMUSCULAR | Status: AC
Start: 2014-11-01 — End: 2014-11-01
  Administered 2014-11-01: 10 mg via INTRAVENOUS
  Filled 2014-11-01: qty 2

## 2014-11-01 MED ORDER — KETOROLAC TROMETHAMINE 30 MG/ML IJ SOLN
30.0000 mg | Freq: Once | INTRAMUSCULAR | Status: AC
Start: 2014-11-01 — End: 2014-11-01
  Administered 2014-11-01: 30 mg via INTRAVENOUS
  Filled 2014-11-01: qty 1

## 2014-11-01 MED ORDER — BUTALBITAL-APAP-CAFFEINE 50-325-40 MG PO TABS
1.0000 | ORAL_TABLET | ORAL | Status: DC | PRN
Start: 2014-11-01 — End: 2014-12-16

## 2014-11-01 MED ORDER — SODIUM CHLORIDE 0.9 % IV BOLUS
1000.0000 mL | Freq: Once | INTRAVENOUS | Status: AC
Start: 2014-11-01 — End: 2014-11-01
  Administered 2014-11-01: 1000 mL via INTRAVENOUS

## 2014-11-01 MED ORDER — DIPHENHYDRAMINE HCL 50 MG/ML IJ SOLN
25.0000 mg | Freq: Once | INTRAMUSCULAR | Status: AC
Start: 2014-11-01 — End: 2014-11-01
  Administered 2014-11-01: 25 mg via INTRAVENOUS
  Filled 2014-11-01: qty 1

## 2014-11-01 NOTE — Discharge Instructions (Signed)
1. Return immediately if worse in any way.    2. Follow up with neurology for recheck and further evaluation.      Headache    You have been treated for a headache.    Headaches are very common. Most of the time they are benign (not harmful). Some headaches can be very serious. Your headache appears to be benign. The doctor feels it is OK for you to go home.    If you continue to have headaches, or if this headache does not resolve over the next few days, you should be evaluated by your regular doctor or a neurologist. Keep a "headache diary." This may help your doctor learn the cause of your headaches.    Take your headache medication as directed. This is especially important if your doctor has placed you on a daily medication to prevent headaches.    YOU SHOULD SEEK MEDICAL ATTENTION IMMEDIATELY, EITHER HERE OR AT THE NEAREST EMERGENCY DEPARTMENT, IF ANY OF THE FOLLOWING OCCURS:   Your headache gets worse.   You have a severe headache that occurs suddenly.   Your head pain is different from your normal headache.   You have a fever (temperature higher than 100.4F / 38C), especially with a stiff neck.   You feel numbness, tingling, or weakness in your arms or legs.   You pass out.   You have problems with your vision.   You vomit and have trouble taking medication or keeping it down.

## 2014-11-01 NOTE — ED Notes (Signed)
Pt c/o headache, dizziness, nausea x3 days. Per pt hx of headaches with same symptoms.

## 2014-11-02 NOTE — ED Provider Notes (Signed)
EMERGENCY DEPARTMENT NOTE    Physician/Midlevel provider first contact with patient: 11/01/14 2116         HISTORY OF PRESENT ILLNESS   Historian:Patient  Translator Used: No    Chief Complaint: Migraine     24 y.o. female presents with headache.  Headache is the same as previous HA and not sudden or maximal in onset.  No numbness, tingling, weakness, or vision changes.  No fever/chills.  No trauma.  Patient does not use anticoagulation. No FH of intracerebral aneurysm .    1. Location of symptoms: head  2. Onset of symptoms: 3 days PTA  3. What was patient doing when symptoms started (Context): see above  4. Severity: moderate  5. Timing: constant  6. Activities that worsen symptoms: none  7. Activities that improve symptoms: none  8. Quality: ache  9. Radiation of symptoms: no  10. Associated signs and Symptoms: see above  11. Are symptoms worsening? yes  MEDICAL HISTORY     Past Medical History:  History reviewed. No pertinent past medical history.    Past Surgical History:  History reviewed. No pertinent past surgical history.    Social History:  Social History     Social History   . Marital Status: Single     Spouse Name: N/A   . Number of Children: N/A   . Years of Education: N/A     Occupational History   . Not on file.     Social History Main Topics   . Smoking status: Never Smoker    . Smokeless tobacco: Not on file   . Alcohol Use: No   . Drug Use: No   . Sexual Activity: Not on file     Other Topics Concern   . Not on file     Social History Narrative       Family History:  Review and noncontributory     Outpatient Medication:  Discharge Medication List as of 11/01/2014 11:05 PM            REVIEW OF SYSTEMS   Review of Systems   Constitutional: Negative for fever and chills.   HENT: Negative for congestion, ear discharge, ear pain, hearing loss, nosebleeds, sore throat and tinnitus.    Eyes: Negative for blurred vision, double vision, pain, discharge and redness.   Respiratory: Negative for cough,  shortness of breath and stridor.    Cardiovascular: Negative for chest pain, orthopnea and PND.   Gastrointestinal: Negative for abdominal pain, diarrhea and blood in stool.   Genitourinary: Negative for dysuria, urgency, frequency, hematuria and flank pain.   Musculoskeletal: Negative for back pain, falls and neck pain.   Neurological: Positive for headaches. Negative for dizziness, tingling, tremors, sensory change, speech change, focal weakness, seizures and loss of consciousness.   Psychiatric/Behavioral: Negative for depression, suicidal ideas, hallucinations and substance abuse.   All other systems reviewed and are negative.       PHYSICAL EXAM   ED Triage Vitals   Enc Vitals Group      BP 11/01/14 2057 139/86 mmHg      Heart Rate 11/01/14 2057 68      Resp Rate 11/01/14 2057 18      Temp 11/01/14 2057 98 F (36.7 C)      Temp Source 11/01/14 2057 Oral      SpO2 11/01/14 2057 98 %      Weight 11/01/14 2057 76.658 kg      Height 11/01/14 2057 1.702 m  Head Cir --       Peak Flow --       Pain Score 11/01/14 2057 8      Pain Loc --       Pain Edu? --       Excl. in GC? --    Nursing note and vitals reviewed.  Constitutional:  Well developed, well nourished.  Awake & alert.    Head:  Atraumatic.  Normocephalic.  No temporal ttp.  Normal temporal artery pulsation  Eyes:  PERRL.  EOMI.  Conjunctivae are not pale. No discharge from eyes b/.  ENT:  Mucous membranes are moist and intact.  Oropharynx is clear and symmetric.  Patent airway. No facial ttp.  Neck:  Supple.  Full ROM.  No JVD.  No lymphadenopathy. No carotid bruit. No meningeal signs.  Cardiovascular:  Regular rate.  Regular rhythm.   Pulmonary/Chest:  No evidence of respiratory distress.  Clear to auscultation bilaterally.  No wheezing, rales or rhonchi. Chest non-tender.  Abdominal:  Soft and non-distended.  There is no tenderness.   Back:  No tenderness. No deformity.  Extremities:  No edema.   No cyanosis.  No clubbing.    Skin:  Skin is warm and  dry.  No diaphoresis. No facial rash.   Neurological:  Alert, awake, and appropriate.  Normal speech.  Sensation normal b/l UE and LE. cranial nerves 2-10 intact. Motor is intact b/l UE and LE.  Normal gate.  Psychiatric:  Good eye contact.  Normal interaction, affect, and behavior     MEDICAL DECISION MAKING     DISCUSSION    Patient presents with headache.  Patient has history of multiple episodes of similar HA.  No vision changes.  No trauma.  No fever/chills.  No numbness, weakness, tingling.  Headache not sudden in onset or maximal in onset. Doubt ICH or infectious process.  Patient pain improving in the ED.  Will d/c home follow up PCP return if worse.  Patient understands and agrees with plan.      Vital Signs: Reviewed the patient?s vital signs.   Nursing Notes: Reviewed and utilized available nursing notes.  Medical Records Reviewed: Reviewed available past medical records.  Counseling: The emergency provider has spoken with the patient and discussed today?s findings, in addition to providing specific details for the plan of care.  Questions are answered and there is agreement with the plan.        PULSE OXIMETRY    Oxygen Saturation by Pulse Oximetry: 100%  Interventions: none  Interpretation:  normal    EMERGENCY DEPT. MEDICATIONS      ED Medication Orders     Start Ordered     Status Ordering Provider    11/01/14 2144 11/01/14 2143  ketorolac (TORADOL) injection 30 mg   Once     Route: Intravenous  Ordered Dose: 30 mg     Last MAR action:  Given Bethann Berkshire D    11/01/14 2144 11/01/14 2143  sodium chloride 0.9 % bolus 1,000 mL   Once     Route: Intravenous  Ordered Dose: 1,000 mL     Last MAR action:  Stopped Bethann Berkshire D    11/01/14 2144 11/01/14 2143  metoclopramide (REGLAN) injection 10 mg   Once     Route: Intravenous  Ordered Dose: 10 mg     Last MAR action:  Given Bethann Berkshire D    11/01/14 2144 11/01/14 2143  diphenhydrAMINE (BENADRYL) injection 25 mg  Once     Route: Intravenous   Ordered Dose: 25 mg     Last MAR action:  Given Alyzabeth Pontillo D    11/01/14 2144 11/01/14 2143  magnesium sulfate 1g in dextrose 5% IVPB (premix)   Once     Route: Intravenous  Ordered Dose: 1 g     Last MAR action:  Stopped Waunita Sandstrom D          LABORATORY RESULTS    Ordered and independently interpreted AVAILABLE laboratory tests. Please see results section in chart for full details.  Results for orders placed or performed during the hospital encounter of 11/01/14   Comprehensive Metabolic Panel (CMP)   Result Value Ref Range    Glucose 88 70 - 100 mg/dL    BUN 17 7 - 19 mg/dL    Creatinine 0.8 0.6 - 1.0 mg/dL    Sodium 540 981 - 191 mEq/L    Potassium 4.0 3.5 - 5.1 mEq/L    Chloride 104 100 - 111 mEq/L    CO2 27 22 - 29 mEq/L    Calcium 10.0 8.5 - 10.5 mg/dL    Protein, Total 7.0 6.0 - 8.3 g/dL    Albumin 4.1 3.5 - 5.0 g/dL    AST (SGOT) 14 5 - 34 U/L    ALT 17 0 - 55 U/L    Alkaline Phosphatase 84 37 - 106 U/L    Bilirubin, Total 0.1 (L) 0.2 - 1.2 mg/dL    Globulin 2.9 2.0 - 3.6 g/dL    Albumin/Globulin Ratio 1.4 0.9 - 2.2    Anion Gap 11.0 5.0 - 15.0   CBC with differential   Result Value Ref Range    WBC 7.40 3.50 - 10.80 x10 3/uL    Hgb 13.5 12.0 - 16.0 g/dL    Hematocrit 47.8 29.5 - 47.0 %    Platelets 239 140 - 400 x10 3/uL    RBC 4.45 4.20 - 5.40 x10 6/uL    MCV 92.6 80.0 - 100.0 fL    MCH 30.3 28.0 - 32.0 pg    MCHC 32.8 32.0 - 36.0 g/dL    RDW 12 12 - 15 %    MPV 11.0 9.4 - 12.3 fL    Neutrophils 61 None %    Lymphocytes Automated 29 None %    Monocytes 8 None %    Eosinophils Automated 2 None %    Basophils Automated 0 None %    Immature Granulocyte 0 None %    Nucleated RBC 0 0 - 1 /100 WBC    Neutrophils Absolute 4.51 1.80 - 8.10 x10 3/uL    Abs Lymph Automated 2.15 0.50 - 4.40 x10 3/uL    Abs Mono Automated 0.57 0.00 - 1.20 x10 3/uL    Abs Eos Automated 0.15 0.00 - 0.70 x10 3/uL    Absolute Baso Automated 0.02 0.00 - 0.20 x10 3/uL    Absolute Immature Granulocyte 0.01 0 x10 3/uL   GFR    Result Value Ref Range    EGFR >60.0    HCG, Qualitative, Urine   Result Value Ref Range    Urine HCG Qualitative Negative Negative         DIAGNOSIS      Diagnosis:  Final diagnoses:   Frontal headache       Disposition:  ED Disposition     Discharge Kyliah Antoinette Porchia discharge to home/self care.    Condition at disposition: Stable  Patient is stable and understands discharge instructions.          Prescriptions:  Discharge Medication List as of 11/01/2014 11:05 PM      START taking these medications    Details   butalbital-acetaminophen-caffeine (FIORICET) 50-325-40 MG per tablet Take 1 tablet by mouth every 4 (four) hours as needed for Pain., Starting 11/01/2014, Until Discontinued, Print               Shela Nevin, MD  11/02/14 2235

## 2014-12-16 ENCOUNTER — Emergency Department: Payer: Self-pay

## 2014-12-16 ENCOUNTER — Emergency Department
Admission: EM | Admit: 2014-12-16 | Discharge: 2014-12-16 | Disposition: A | Discharge status: Home / Self Care | Payer: Self-pay | Attending: Student in an Organized Health Care Education/Training Program | Admitting: Student in an Organized Health Care Education/Training Program

## 2014-12-16 DIAGNOSIS — R079 Chest pain, unspecified: Secondary | ICD-10-CM | POA: Insufficient documentation

## 2014-12-16 LAB — BASIC METABOLIC PANEL
Anion Gap: 9 (ref 5.0–15.0)
BUN: 11 mg/dL (ref 7–19)
CO2: 29 mEq/L (ref 22–29)
Calcium: 9.8 mg/dL (ref 8.5–10.5)
Chloride: 103 mEq/L (ref 100–111)
Creatinine: 0.7 mg/dL (ref 0.6–1.0)
Glucose: 101 mg/dL — ABNORMAL HIGH (ref 70–100)
Potassium: 3.8 mEq/L (ref 3.5–5.1)
Sodium: 141 mEq/L (ref 136–145)

## 2014-12-16 LAB — GFR: EGFR: >60

## 2014-12-16 LAB — IHS D-DIMER: D-Dimer: 0.46 ug/mL FEU (ref 0.00–0.51)

## 2014-12-16 LAB — URINE HCG QUALITATIVE: Urine HCG Qualitative: NEGATIVE

## 2014-12-16 MED ORDER — SODIUM CHLORIDE 0.9 % IV BOLUS
1000.0000 mL | Freq: Once | INTRAVENOUS | Status: AC
Start: 2014-12-16 — End: 2014-12-16
  Administered 2014-12-16: 1000 mL via INTRAVENOUS

## 2014-12-16 NOTE — Discharge Instructions (Signed)
Please continue to monitor your symptoms  Take motrin/tylenol as needed for pain  Apply warm compress as needed  Return to the ER if worsening chest pain/difficulty breathing/dizziness/lightheadedness/weakness/fever/chills/nausea/vomiting or any other worsening concerns  Please follow up with your primary care physician or [facility]    Thank you for allowing [place] to address your concerns. Hope you feel better soon!  Sharon Seller M.D.  Emergency Medicine    Chest Pain of Unclear Etiology    You have been seen for chest pain. The cause of your pain is not yet known.    Your doctor has learned about your medical history, examined you, and checked any tests that were done. Still, it is unclear why you are having pain. The doctor thinks there is only a very small chance that your pain is caused by a life-threatening condition. Later, your primary care doctor might do more tests or check you again.    Sometimes chest pain is caused by a dangerous condition, like a heart attack, aorta injury, blood clot in the lung, or collapsed lung. It is unlikely that your pain is caused by a life-threatening condition if: Your chest pain lasts only a few seconds at a time; you are not short of breath, nauseated (sick to your stomach), sweaty, or lightheaded; your pain gets worse when you twist or bend; your pain improves with exercise or hard work.    Chest pain is serious. It is VERY IMPORTANT that you follow up with your regular doctor and seek medical attention immediately here or at the nearest Emergency Department if your symptoms become worse or they change.    YOU SHOULD SEEK MEDICAL ATTENTION IMMEDIATELY, EITHER HERE OR AT THE NEAREST EMERGENCY DEPARTMENT, IF ANY OF THE FOLLOWING OCCURS:   Your pain gets worse.   Your pain makes you short of breath, nauseated, or sweaty.   Your pain gets worse when you walk, go up stairs, or exert yourself.   You feel weak, lightheaded, or faint.   It hurts to  breathe.   Your leg swells.   Your symptoms get worse or you have new symptoms or concerns.

## 2014-12-16 NOTE — ED Notes (Signed)
Pt arrived with a friend c/o of chest intermittent chest pain for 1-2 weeks, tonight the patient had dizziness with chest pain

## 2014-12-16 NOTE — ED Notes (Signed)
Pt vss for d/c home. D/c instructions reviewed. Pt verbalized understanding.

## 2014-12-16 NOTE — ED Provider Notes (Signed)
EMERGENCY DEPARTMENT NOTE    Physician/Midlevel provider first contact with patient: 12/16/14 0408         HISTORY OF PRESENT ILLNESS   Historian:Patient  Translator Used: No    Chief Complaint: Chest Pain           24 y.o. female with hx noted below, p/w chest pain. Pt reports intermittent pain on chest x ~2weeks. Pain is sharp. Discomfort has been intermittent but tonight seems constant. Pain on retrosternal/left side of chest. Mild SOB/lightheadedness earlier today. No cough/fever/chills/N/V/syncope. No leg pain or swelling. No hx of immobilization      MEDICAL HISTORY     Past Medical History:  History reviewed. No pertinent past medical history.    Past Surgical History:  History reviewed. No pertinent past surgical history.    Social History:  Social History     Social History   . Marital Status: Single     Spouse Name: N/A   . Number of Children: N/A   . Years of Education: N/A     Occupational History   . Not on file.     Social History Main Topics   . Smoking status: Never Smoker    . Smokeless tobacco: Not on file   . Alcohol Use: No   . Drug Use: No   . Sexual Activity: Not on file     Other Topics Concern   . Not on file     Social History Narrative       Family History:  History reviewed. No pertinent family history.    Outpatient Medication:  There are no discharge medications for this patient.        REVIEW OF SYSTEMS     Review of Systems: All other systems reviewed and are negative  CONSTITUTIONAL: No weight loss, fever, chills, weakness or fatigue.  HEENT: No visual loss, blurred vision, double vision or yellow sclerae. No hearing loss, sneezing, congestion, runny nose or sore throat.  SKIN: No rash or itching.  CARDIOVASCULAR: chest pain  RESPIRATORY: SOB  GASTROINTESTINAL: No anorexia, nausea, vomiting or diarrhea. No abdominal pain or blood.  GENITOURINARY: No dysuria or hematuria  NEUROLOGICAL: lightheadedness. No syncope, paralysis, ataxia, numbness or tingling in the extremities. No change in  bowel or bladder control.  MUSCULOSKELETAL: No muscle, back pain, joint pain or stiffness.          PHYSICAL EXAM   BP 124/75 mmHg  Pulse 71  Temp(Src) 98 F (36.7 C) (Temporal Artery)  Resp 16  Ht 5\' 7"  (1.702 m)  Wt 77.111 kg  BMI 26.62 kg/m2  SpO2 99%  LMP 11/24/2014  Breastfeeding? Yes  Physical Exam:  GEN: NAD, A&O x3, appears well  HEENT: EOMI, PERLA b/l, MMM  NECK: soft/supple  CV: RRR, nl S1/S2, equal pulses b/l  CHEST: exam done with female chaperone in room.  +TTP over sternum  No erythema/increased warmth/fluctuance/TTP breasts b/l  No LAD  LUNG: CTA b/l, good air entry b/l  ABDOMEN: soft, ND, NT, +BS, no R/G  EXTREMITIES: no edema/erythema, TTP  NEURO: CN II-XII intact, motor strength 5/5 all ext, sensation intact, no ataxia  SKIN: no rash in dermatomal distribution        MEDICAL DECISION MAKING       Vital Signs: Reviewed the patient?s vital signs.   Nursing Notes: Reviewed and utilized available nursing notes.  Medical Records Reviewed: Reviewed available past medical records.  Counseling: The emergency provider has spoken with the patient and discussed today?s  findings, in addition to providing specific details for the plan of care.  Questions are answered and there is agreement with the plan.      CARDIAC STUDIES    The following cardiac studies were independently interpreted by the Emergency Medicine Physician.  For full cardiac study results please see chart.        EKG Interpretation:  Signed and interpreted byED Physician   Time Interpreted: 3:59  Comparison: 08/25/14  Rate: 75  Rhythm: NSR  Axis: normal  Intervals: normal  Blocks: none  ST segments: no ST elevation/T wave inversions  Interpretation: NSR    IMAGING STUDIES    The following imaging studies were independently interpreted by the Emergency Medicine Physician.  For full imaging study results please see chart.  CXR: NAD      PULSE OXIMETRY    Oxygen Saturation by Pulse Oximetry: 99%  Interventions: none  Interpretation:   normal    EMERGENCY DEPT. MEDICATIONS      ED Medication Orders     Start Ordered     Status Ordering Provider    12/16/14 [phone] 12/16/14 0413  sodium chloride 0.9 % bolus 1,000 mL   Once     Route: Intravenous  Ordered Dose: 1,000 mL     Last MAR action:  Stopped Laraina Sulton          LABORATORY RESULTS    Ordered and independently interpreted AVAILABLE laboratory tests. Please see results section in chart for full details.            DISCUSSION/ED COURSE    24 y.o. female p/w chest pain. Pt appearing well. EKG/CXR/lab work including DDimer unremarkable. Pt with reproducible tenderness. No evidence of mastitis on exam    Reassessment at the time of disposition demonstrates that the pt is in no acute distress.  She has remained hemodynamically stable throughout the entire ED visit and is without objective evidence for acute process requiring urgent intervention or hospitalization.  The pt is stable for discharge, counseling is provided as documented below, discussed symptomatic treatment and specific conditions for return.  Pt will closely monitor symptoms and followup. Will return immediately if any worsening symptoms/concerns.       DIAGNOSIS      Diagnosis:  Final diagnoses:   Chest pain, unspecified type       Disposition:  ED Disposition     Discharge Otho Perl discharge to home/self care.    Condition at disposition: Stable            Prescriptions:  There are no discharge medications for this patient.                Sharon Seller, MD  12/19/14 [phone]

## 2014-12-19 LAB — ECG 12-LEAD
Atrial Rate: 75 {beats}/min
P Axis: 68 degrees
P-R Interval: 170 ms
Q-T Interval: 370 ms
QRS Duration: 82 ms
QTC Calculation (Bezet): 413 ms
R Axis: 63 degrees
T Axis: 58 degrees
Ventricular Rate: 75 {beats}/min

## 2016-04-26 ENCOUNTER — Emergency Department: Payer: Medicaid Other

## 2016-04-26 ENCOUNTER — Emergency Department
Admission: EM | Admit: 2016-04-26 | Discharge: 2016-04-26 | Disposition: A | Discharge status: Home / Self Care | Payer: Medicaid Other | Attending: Emergency Medicine | Admitting: Emergency Medicine

## 2016-04-26 DIAGNOSIS — R52 Pain, unspecified: Secondary | ICD-10-CM

## 2016-04-26 DIAGNOSIS — J101 Influenza due to other identified influenza virus with other respiratory manifestations: Secondary | ICD-10-CM | POA: Insufficient documentation

## 2016-04-26 MED ORDER — IBUPROFEN 600 MG PO TABS
600.0000 mg | ORAL_TABLET | Freq: Four times a day (QID) | ORAL | 0 refills | Status: AC | PRN
Start: 2016-04-26 — End: ?

## 2016-04-26 MED ORDER — OSELTAMIVIR PHOSPHATE 75 MG PO CAPS
75.0000 mg | ORAL_CAPSULE | Freq: Two times a day (BID) | ORAL | 0 refills | Status: AC
Start: 2016-04-26 — End: 2016-05-01

## 2016-04-26 MED ORDER — ONDANSETRON 4 MG PO TBDP
4.0000 mg | ORAL_TABLET | Freq: Four times a day (QID) | ORAL | 0 refills | Status: AC | PRN
Start: 2016-04-26 — End: ?

## 2016-04-26 MED ORDER — ACETAMINOPHEN 500 MG PO TABS
1000.0000 mg | ORAL_TABLET | Freq: Once | ORAL | Status: AC
Start: 2016-04-26 — End: 2016-04-26
  Administered 2016-04-26: 1000 mg via ORAL
  Filled 2016-04-26: qty 2

## 2016-04-26 NOTE — ED Provider Notes (Signed)
EMERGENCY DEPARTMENT NOTE    Physician/Midlevel provider first contact with patient: 04/26/16 1733         HISTORY OF PRESENT ILLNESS   Historian:Patient  Translator Used: No    Chief Complaint: Flu like symptoms       26 y.o. female previously healthy who presents with body aches and cough. Pt reports onset of symptoms was two days prior. Reports continued cough. Pt also reports body aches. No nausea or vomiting. Does reports decreased appetite.     1. Location of symptoms: total body   2. Onset of symptoms: 2 days   3. What was patient doing when symptoms started (Context): see above  4. Severity: moderate  5. Timing: constant   6. Activities that worsen symptoms: n/a  7. Activities that improve symptoms: n/a  8. Quality: aching   9. Radiation of symptoms: no  10. Associated signs and Symptoms: see above  11. Are symptoms worsening? yes  MEDICAL HISTORY     Past Medical History:  History reviewed. No pertinent past medical history.    Past Surgical History:  History reviewed. No pertinent surgical history.    Social History:  Social History     Social History   . Marital status: Single     Spouse name: N/A   . Number of children: N/A   . Years of education: N/A     Occupational History   . Not on file.     Social History Main Topics   . Smoking status: Never Smoker   . Smokeless tobacco: Not on file   . Alcohol use No   . Drug use: No   . Sexual activity: Not on file     Other Topics Concern   . Not on file     Social History Narrative   . No narrative on file       Family History:  History reviewed. No pertinent family history.    Outpatient Medication:  Discharge Medication List as of 04/26/2016  7:06 PM            REVIEW OF SYSTEMS   Review of Systems  Constitutional: Positive for fevers and body aches  HENT: Negative for congestion and sore throat.  Eyes: Negative for eye discharge and eye redness.   Cardiovascular: Negative for chest pain and chest pressure   Respiratory: Positive for cough.     Gastrointestinal: Negative for nausea, vomiting, abdominal pain and diarrhea.   Genitourinary: Negative for dysuria, urgency and frequency.   Neurological: Negative for dizziness, focal weakness, numbness.   All other systems negative.  PHYSICAL EXAM     ED Triage Vitals [04/26/16 1731]   Enc Vitals Group      BP 112/65      Heart Rate (!) 141      Resp Rate 19      Temp 99.9 F (37.7 C)      Temp Source Temporal Art      SpO2 97 %      Weight       Height       Head Circumference       Peak Flow       Pain Score 5      Pain Loc       Pain Edu?       Excl. in GC?        Constitutional: Vital signs reviewed. Well appearing, well hydrated, well perfused, non-toxic appearing, no apparent distress  Head:  Normocephalic,  atraumatic  Eyes: PERRL, normal conjunctiva bilaterally, EOMI  ENT: Mucous membranes moist.  .  Neck: Normal range of motion. Non-tender.   Respiratory/Chest: clear to auscultation. No work of breathing. No tachypnea..  Cardiovascular: tachycardia noted   Abdomen: Soft and nontender in all quadrants. No guarding or rebound. No masses or hepatosplenomegaly.Marland Kitchen  UpperExtremity: No edema or cyanosis.  LowerExtremity: No edema or cyanosis.  Neurological: Awake and alert. No focal motor deficits by observation.  Skin: Warm and dry. No rash.  Lymphatic: No cervical lymphadenopathy.    MEDICAL DECISION MAKING     26 y.o. female previously healthy who presents with body aches and cough    Slight tachycardia noted   Pt with no nausea or vomiting   Denies shortness of breath     Will PO hydrate, tylenol for symptom relief     Influenza swab positive for influenza A  Improvement in HR with PO hydration    rx for tamiflu and zofran written with outpatient follow-up       DISCUSSION        Vital Signs: Reviewed the patient?s vital signs.   Nursing Notes: Reviewed and utilized available nursing notes.  Medical Records Reviewed: Reviewed available past medical records.  Counseling: The emergency provider has spoken  with the patient and discussed today?s findings, in addition to providing specific details for the plan of care.  Questions are answered and there is agreement with the plan.      CARDIAC STUDIES    The following cardiac studies were independently interpreted by the Emergency Medicine Physician.  For full cardiac study results please see chart.    Monitor Strip  Interpreted by ED Physician  Rate: sinus tachycardia   Rhythm: 118  ST Changes: none      IMAGING STUDIES    The following imaging studies were independently interpreted by the Emergency Medicine Physician.  For full imaging study results please see chart.      PULSE OXIMETRY    Oxygen Saturation by Pulse Oximetry: 99%  Interventions: none  Interpretation:  Normal     EMERGENCY DEPT. MEDICATIONS      ED Medication Orders     Start Ordered     Status Ordering Provider    04/26/16 1739 04/26/16 1738  acetaminophen (TYLENOL) tablet 1,000 mg  Once     Route: Oral  Ordered Dose: 1,000 mg     Last MAR action:  Given [id number]-TWUM, Lizzie Cokley          LABORATORY RESULTS    Ordered and independently interpreted AVAILABLE laboratory tests. Please see results section in chart for full details.  Results for orders placed or performed during the hospital encounter of 12/16/14   Urine HCG Qual   Result Value Ref Range    Urine HCG Qualitative Negative Negative   Basic Metabolic Panel (BMP)   Result Value Ref Range    Glucose 101 (H) 70 - 100 mg/dL    BUN 11 7 - 19 mg/dL    Creatinine 0.7 0.6 - 1.0 mg/dL    Calcium 9.8 8.5 - 16.1 mg/dL    Sodium 096 045 - 409 mEq/L    Potassium 3.8 3.5 - 5.1 mEq/L    Chloride 103 100 - 111 mEq/L    CO2 29 22 - 29 mEq/L    Anion Gap 9.0 5.0 - 15.0   D-Dimer   Result Value Ref Range    D-Dimer 0.46 0.00 - 0.51 ug/mL FEU   GFR  Result Value Ref Range    EGFR >60.0    ECG 12 Lead   Result Value Ref Range    Ventricular Rate 75 BPM    Atrial Rate 75 BPM    P-R Interval 170 ms    QRS Duration 82 ms    Q-T Interval 370 ms    QTC Calculation (Bezet)  413 ms    P Axis 68 degrees    R Axis 63 degrees    T Axis 58 degrees           DIAGNOSIS      Diagnosis:  Final diagnoses:   Body aches   Influenza A       Disposition:  ED Disposition     ED Disposition Condition Date/Time Comment    Discharge  Thu Apr 26, 2016  7:06 PM Otho Perl discharge to home/self care.    Condition at disposition: Stable          Prescriptions:  Discharge Medication List as of 04/26/2016  7:06 PM      START taking these medications    Details   ibuprofen (ADVIL,MOTRIN) 600 MG tablet Take 1 tablet (600 mg total) by mouth every 6 (six) hours as needed for Pain., Starting Thu 04/26/2016, Print      ondansetron (ZOFRAN-ODT) 4 MG disintegrating tablet Take 1 tablet (4 mg total) by mouth every 6 (six) hours as needed for Nausea., Starting Thu 04/26/2016, Print      oseltamivir (TAMIFLU) 75 MG capsule Take 1 capsule (75 mg total) by mouth 2 (two) times daily.for 5 days, Starting Thu 04/26/2016, Until Tue 05/01/2016, Print                  Rulon Abide, MD  04/30/16 2326

## 2016-04-26 NOTE — ED Notes (Signed)
VSS. Patient A&Ox4

## 2016-04-26 NOTE — Discharge Instructions (Signed)
Influenza, Seasonal     You have been seen for influenza. This is sometimes called “the flu.” Often, no tests are done but influenza is strongly suspected. For this reason, many doctors are giving the diagnosis of “Influenza-Like Illness.”     Influenza is very contagious. This means it spreads easily among people. It is caused by the influenza virus. That is why it is also called "the flu." It affects the respiratory tract. This includes the nose, throat and lungs. In some people, it can cause very serious problems. This usually happens with the very young and the elderly. The Centers for Disease Control and Prevention (CDC) reports that an average of 5-20% of people living in the [place] are affected by regular seasonal influenza every year. This includes influenza A and B. More than 200,000 people are hospitalized from flu complications. About 36,000 people die from flu-related causes every year.     STAY AWAY FROM OTHER PEOPLE WHO ARE NOT YET AFFECTED. THIS IS THE BEST WAY TO STOP THE SPREAD OF INFLUENZA. YOU CAN SPREAD THE FLU FROM ONE DAY BEFORE YOU GET SICK UP TO 7 DAYS AFTER YOU DEVELOP SYMPTOMS.     UNTIL IT IS CONFIRMED THAT YOU DON’T HAVE INFLUENZA, YOU SHOULD:  · Stay off from work. You should be off of work for 7 days from the start of symptoms or until the viral cultures show you don’t have influenza.  · Stay home and do not go into public places.  · Stay away from others in the house.  · Wash your hands often.  · Cover your mouth when you sneeze or cough. Use your sleeve to cover your mouth and nose. Do not use your hand.     People you live with and those you had close contact with, should watch for flu signs. People with a serious medical condition or who are pregnant should tell their doctor they were exposed to flu.     Influenza often happens as an epidemic in the fall and winter. This means many people get the flu virus at the same time. It spreads from person to person. Over the centuries, there  have been epidemics of many different “new” strains of influenza.     Most people with influenza get these symptoms:  · High fevers: Fevers are often higher than 102°F or 39°C.  · Cough. A dry, hacking cough is most common.  · Headache: The headache may be worse with the fever. It can be there even when the temperature is normal.  · Muscle aches. Many people have muscle pain. These are called “myalgias.”  · Sore throat.     You may also feel weak and fatigued (tired). You may also get a runny nose or congestion (stuffy nose). Some people will have mild stomach symptoms. This can include vomiting (throwing up). Sometimes it includes mild diarrhea.     Influenza does not primarily involve vomiting and diarrhea. Vomiting and diarrhea are usually symptoms of a completely different problem. This problem is called "gastroenteritis." Gastroenteritis is often called "the stomach flu." This is how the confusion came about! True influenza is a lung infection.     During flu season, influenza can sometimes be diagnosed without tests. This can be done if a patient has the right set of symptoms. These are called "classic flu" symptoms. Sometimes flu testing is done. This is usually with a test called a “Rapid Influenza Test.” Results show up in a few minutes. The test is not perfect. It misses many cases. Sometimes, more specialized tests   are done. These tests pick up more of the cases. However, results are not ready for a few days.     Usually, the "flu shot" is good at preventing influenza. Some years the shot is less effective than others. You can sometimes still get severe influenza, even if you had the immunization (shot). You can still get "the stomach flu," (not true influenza), even after the flu shot. Today, the current Influenza vaccine is designed to protect against seasonal Influenza A and B and H1N1. There are good supplies of the vaccine available. It is being given to all persons who would like to receive  it.     Influenza treatment depends on how early in the course of the illness you are when diagnosed. With medicine, you may feel better sooner. Medicines do not cure the flu. However, they do help your body fight the infection.     Some antiviral medicines may help symptoms. They often only help if they are started in the first few days of the illness. The Centers for Disease Control and Prevention (CDC) makes recommendations as to who should get the antiviral medicine. Your doctor will decide if the medicine may be helpful for you.     Most patients get better with simple things like rest, fluids and a little time. Acetaminophen (Tylenol®) and/or ibuprofen (Advil® or Motrin®) can also help. Older patients or those with serious medical problems may have worse problems with the flu. They may need additional care.     Treatment of influenza usually is at home. Some patients need to be in the hospital. These are often very young and very old patients and those with severe symptoms. It also includes those with other serious medical problems.     It is OK for you to go home now. Follow the instructions above about staying away from others!     YOU SHOULD SEEK MEDICAL ATTENTION IMMEDIATELY, EITHER HERE OR AT THE NEAREST EMERGENCY DEPARTMENT, IF ANY OF THE FOLLOWING OCCURS:  · You have shortness of breath, wheezing or any severe problems breathing.   · You have a headache that gets worse or a stiff neck.  · You feel lightheaded or faint.  · You have chest pain.  · You have a cough and fever (temperature higher than 100.4ºF or 38ºC) that seem to come back AFTER your flu symptoms get better. This can be a different kind of lung infection called “pneumonia,” that sometimes happens after the flu.  · You feel sicker at any time or do not get better as expected. You can expect to feel sick for a week or so.  · You have any other new symptoms or concerns.     If you can t follow up with your doctor, or if at any time you feel  you need to be rechecked or seen again, come back here or go to the nearest emergency department.

## 2020-03-20 ENCOUNTER — Ambulatory Visit (HOSPITAL_COMMUNITY)
Admission: EM | Admit: 2020-03-20 | Discharge: 2020-03-20 | Disposition: A | Discharge status: Home / Self Care | Payer: 59 | Attending: Obstetrics and Gynecology | Admitting: Obstetrics and Gynecology

## 2020-03-20 ENCOUNTER — Other Ambulatory Visit: Payer: Self-pay

## 2020-03-20 ENCOUNTER — Encounter (HOSPITAL_COMMUNITY): Payer: Self-pay | Admitting: Emergency Medicine

## 2020-03-20 ENCOUNTER — Observation Stay (HOSPITAL_COMMUNITY): Payer: 59

## 2020-03-20 DIAGNOSIS — O4690 Antepartum hemorrhage, unspecified, unspecified trimester: Secondary | ICD-10-CM

## 2020-03-20 DIAGNOSIS — O209 Hemorrhage in early pregnancy, unspecified: Secondary | ICD-10-CM | POA: Diagnosis present

## 2020-03-20 DIAGNOSIS — N760 Acute vaginitis: Secondary | ICD-10-CM

## 2020-03-20 DIAGNOSIS — B9689 Other specified bacterial agents as the cause of diseases classified elsewhere: Secondary | ICD-10-CM | POA: Diagnosis not present

## 2020-03-20 DIAGNOSIS — O23591 Infection of other part of genital tract in pregnancy, first trimester: Secondary | ICD-10-CM | POA: Insufficient documentation

## 2020-03-20 DIAGNOSIS — Z3A01 Less than 8 weeks gestation of pregnancy: Secondary | ICD-10-CM | POA: Diagnosis not present

## 2020-03-20 DIAGNOSIS — R1030 Lower abdominal pain, unspecified: Secondary | ICD-10-CM | POA: Insufficient documentation

## 2020-03-20 LAB — COMPREHENSIVE METABOLIC PANEL
ALT: 16 U/L (ref 0–44)
AST: 16 U/L (ref 15–41)
Albumin: 3.6 g/dL (ref 3.5–5.0)
Alkaline Phosphatase: 40 U/L (ref 38–126)
Anion gap: 9 (ref 5–15)
BUN: 7 mg/dL (ref 6–20)
CO2: 24 mmol/L (ref 22–32)
Calcium: 8.9 mg/dL (ref 8.9–10.3)
Chloride: 103 mmol/L (ref 98–111)
Creatinine, Ser: 0.7 mg/dL (ref 0.44–1.00)
GFR, Estimated: >60 mL/min (ref >60)
Glucose, Bld: 90 mg/dL (ref 70–99)
Potassium: 3.7 mmol/L (ref 3.5–5.1)
Sodium: 136 mmol/L (ref 135–145)
Total Bilirubin: 0.2 mg/dL — ABNORMAL LOW (ref 0.3–1.2)
Total Protein: 6.7 g/dL (ref 6.5–8.1)

## 2020-03-20 LAB — CBC WITH DIFFERENTIAL/PLATELET
Abs Immature Granulocytes: 0.03 10*3/uL (ref 0.00–0.07)
Basophils Absolute: 0 10*3/uL (ref 0.0–0.1)
Basophils Relative: 0 %
Eosinophils Absolute: 0.1 10*3/uL (ref 0.0–0.5)
Eosinophils Relative: 1 %
HCT: 42 % (ref 36.0–46.0)
Hemoglobin: 13.6 g/dL (ref 12.0–15.0)
Immature Granulocytes: 0 %
Lymphocytes Relative: 19 %
Lymphs Abs: 1.8 10*3/uL (ref 0.7–4.0)
MCH: 30.2 pg (ref 26.0–34.0)
MCHC: 32.4 g/dL (ref 30.0–36.0)
MCV: 93.3 fL (ref 80.0–100.0)
Monocytes Absolute: 0.5 10*3/uL (ref 0.1–1.0)
Monocytes Relative: 6 %
Neutro Abs: 6.8 10*3/uL (ref 1.7–7.7)
Neutrophils Relative %: 74 %
Platelets: 262 10*3/uL (ref 150–400)
RBC: 4.5 MIL/uL (ref 3.87–5.11)
RDW: 12.3 % (ref 11.5–15.5)
WBC: 9.3 10*3/uL (ref 4.0–10.5)
nRBC: 0 % (ref 0.0–0.2)

## 2020-03-20 LAB — URINALYSIS, ROUTINE W REFLEX MICROSCOPIC
Bilirubin Urine: NEGATIVE
Glucose, UA: NEGATIVE mg/dL
Hgb urine dipstick: NEGATIVE
Ketones, ur: NEGATIVE mg/dL
Leukocytes,Ua: NEGATIVE
Nitrite: NEGATIVE
Protein, ur: NEGATIVE mg/dL
Specific Gravity, Urine: 1.015 (ref 1.005–1.030)
pH: 5 (ref 5.0–8.0)

## 2020-03-20 LAB — I-STAT BETA HCG BLOOD, ED (MC, WL, AP ONLY): I-stat hCG, quantitative: >2000 m[IU]/mL — ABNORMAL HIGH (ref <5)

## 2020-03-20 LAB — OB RESULTS CONSOLE GC/CHLAMYDIA: Gonorrhea: NEGATIVE

## 2020-03-20 LAB — WET PREP, GENITAL
Trich, Wet Prep: NONE SEEN
Yeast Wet Prep HPF POC: NONE SEEN

## 2020-03-20 LAB — HCG, QUANTITATIVE, PREGNANCY: hCG, Beta Chain, Quant, S: 66729 m[IU]/mL — ABNORMAL HIGH (ref <5)

## 2020-03-20 MED ORDER — METRONIDAZOLE 500 MG PO TABS: 500.0000 mg | ORAL_TABLET | Freq: Two times a day (BID) | ORAL | 0 refills | Status: AC

## 2020-03-20 MED ORDER — ACETAMINOPHEN 500 MG PO TABS
1000.0000 mg | ORAL_TABLET | Freq: Once | ORAL | Status: AC
  Administered 2020-03-20: 03:00:00 1000 mg via ORAL
  Filled 2020-03-20: qty 2

## 2020-03-20 NOTE — MAU Provider Note (Signed)
History     CSN: [id number]  Arrival date and time: 03/20/20 0020   Event Date/Time   First Provider Initiated Contact with Patient 03/20/20 0305      Chief Complaint  Patient presents with   Vaginal Bleeding   Abdominal Pain   HPI   [name] is a 29 y.o. female G2P1001  @[redacted]w[redacted]d  here in MAU with complaints of vaginal bleeding x 24 hours. She reports a small amount of pink discharge that she notices when she wipes. She reports lower abdominal cramping which comes and goes. She currently rates her pain 6/10. She has not taken anything for the pain.  Reports recent intercourse.  Transfer from [facility] ED.   OB History    Gravida  2   Para  1   Term  1   Preterm      AB      Living  1     SAB      IAB      Ectopic      Multiple      Live Births              History reviewed. No pertinent past medical history.  Past Surgical History:  Procedure Laterality Date   NO PAST SURGERIES      Family History  Problem Relation Age of Onset   Cancer Maternal Grandmother     Social History   Vaping Use   Vaping Use: Never used  Substance Use Topics   Alcohol use: Never   Drug use: Never    Allergies: No Known Allergies  No medications prior to admission.   Results for orders placed or performed during the hospital encounter of 03/20/20 (from the past 48 hour(s))  I-Stat Beta hCG blood, ED ([facility], [facility], [facility] only)     Status: Abnormal   Collection Time: 03/20/20  1:25 AM  Result Value Ref Range   I-stat hCG, quantitative >2,000.0 (H) <5 mIU/mL   Comment 3            Comment:   GEST. AGE      CONC.  (mIU/mL)   <=1 WEEK        5 - 50     2 WEEKS       50 - 500     3 WEEKS       100 - 10,000     4 WEEKS     1,000 - 30,000        FEMALE AND NON-PREGNANT FEMALE:     LESS THAN 5 mIU/mL   ABO/Rh     Status: None   Collection Time: 03/20/20  2:42 AM  Result Value Ref Range   ABO/RH(D)      A POS Performed at [facility], [address] [place], [place]   Urinalysis, Routine w reflex microscopic     Status: Abnormal   Collection Time: 03/20/20  2:51 AM  Result Value Ref Range   Color, Urine YELLOW YELLOW   APPearance HAZY (A) CLEAR   Specific Gravity, Urine 1.015 1.005 - 1.030   pH 5.0 5.0 - 8.0   Glucose, UA NEGATIVE NEGATIVE mg/dL   Hgb urine dipstick NEGATIVE NEGATIVE   Bilirubin Urine NEGATIVE NEGATIVE   Ketones, ur NEGATIVE NEGATIVE mg/dL   Protein, ur NEGATIVE NEGATIVE mg/dL   Nitrite NEGATIVE NEGATIVE   Leukocytes,Ua NEGATIVE NEGATIVE    Comment: Performed at [facility], [address]  [place], [place] [address]  CBC with Differential/Platelet     Status: None   Collection Time: 03/20/20  2:55 AM  Result Value Ref Range   WBC 9.3 4.0 - 10.5 K/uL   RBC 4.50 3.87 - 5.11 MIL/uL   Hemoglobin 13.6 12.0 - 15.0 g/dL   HCT 41.6 60.6 - 30.1 %   MCV 93.3 80.0 - 100.0 fL   MCH 30.2 26.0 - 34.0 pg   MCHC 32.4 30.0 - 36.0 g/dL   RDW 60.1 09.3 - 23.5 %   Platelets 262 150 - 400 K/uL   nRBC 0.0 0.0 - 0.2 %   Neutrophils Relative % 74 %   Neutro Abs 6.8 1.7 - 7.7 K/uL   Lymphocytes Relative 19 %   Lymphs Abs 1.8 0.7 - 4.0 K/uL   Monocytes Relative 6 %   Monocytes Absolute 0.5 0.1 - 1.0 K/uL   Eosinophils Relative 1 %   Eosinophils Absolute 0.1 0.0 - 0.5 K/uL   Basophils Relative 0 %   Basophils Absolute 0.0 0.0 - 0.1 K/uL   Immature Granulocytes 0 %   Abs Immature Granulocytes 0.03 0.00 - 0.07 K/uL    Comment: Performed at [facility], [address] [place], [place] [address]  Comprehensive metabolic panel     Status: Abnormal   Collection Time: 03/20/20  2:55 AM  Result Value Ref Range   Sodium 136 135 - 145 mmol/L   Potassium 3.7 3.5 - 5.1 mmol/L   Chloride 103 98 - 111 mmol/L   CO2 24 22 - 32 mmol/L   Glucose, Bld 90 70 - 99 mg/dL    Comment: Glucose reference range applies only to samples taken after fasting for at least 8 hours.   BUN 7 6 - 20 mg/dL   Creatinine, Ser 0.25  0.44 - 1.00 mg/dL   Calcium 8.9 8.9 - 42.7 mg/dL   Total Protein 6.7 6.5 - 8.1 g/dL   Albumin 3.6 3.5 - 5.0 g/dL   AST 16 15 - 41 U/L   ALT 16 0 - 44 U/L   Alkaline Phosphatase 40 38 - 126 U/L   Total Bilirubin 0.2 (L) 0.3 - 1.2 mg/dL   GFR, Estimated >06 >23 mL/min    Comment: (NOTE) Calculated using the CKD-EPI Creatinine Equation (2021)    Anion gap 9 5 - 15    Comment: Performed at [facility], [address] [place], [place] [address]  hCG, quantitative, pregnancy     Status: Abnormal   Collection Time: 03/20/20  2:55 AM  Result Value Ref Range   hCG, Beta Chain, Quant, S 66,729 (H) <5 mIU/mL    Comment:          GEST. AGE      CONC.  (mIU/mL)   <=1 WEEK        5 - 50     2 WEEKS       50 - 500     3 WEEKS       100 - 10,000     4 WEEKS     1,000 - 30,000     5 WEEKS     3,500 - 115,000   6-8 WEEKS     12,000 - 270,000    12 WEEKS     15,000 - 220,000        FEMALE AND NON-PREGNANT FEMALE:     LESS THAN 5 mIU/mL Performed at [facility], [address],  [place], [place] [address]   Wet prep, genital     Status: Abnormal   Collection Time: 03/20/20  3:16 AM   Specimen: Vaginal  Result Value Ref Range   Yeast Wet Prep HPF POC NONE SEEN NONE SEEN   Trich, Wet Prep NONE SEEN NONE SEEN   Clue Cells Wet Prep HPF POC PRESENT (A) NONE SEEN   WBC, Wet Prep HPF POC MANY (A) NONE SEEN   Sperm PRESENT     Comment: Performed at [facility], [address], [place], [place] [address]   [place] Comp Less 14 Wks  Result Date: 03/20/2020 CLINICAL DATA:  Initial evaluation for acute pelvic cramping, bleeding. Early pregnancy. EXAM: OBSTETRIC <14 WK ULTRASOUND TECHNIQUE: Transabdominal ultrasound was performed for evaluation of the gestation as well as the maternal uterus and adnexal regions. COMPARISON:  None. FINDINGS: Intrauterine gestational sac: Single Yolk sac:  Present Embryo:  Present Cardiac Activity: Present Heart Rate: 127 bpm CRL: 6.6 mm   6 w 3 d                   [place] EDC: 11/10/2020 Subchorionic hemorrhage:  None visualized. Maternal uterus/adnexae: Ovaries within normal limits bilaterally. Small corpus luteal cyst noted on the left. No adnexal mass or free fluid. IMPRESSION: 1. Single viable intrauterine pregnancy as above, estimated gestational age [redacted] weeks and 3 days by crown-rump length, with ultrasound EDC of 11/10/2020. No complication. 2. No other acute maternal uterine or adnexal abnormality. Electronically Signed   By: Rise Mu M.D.   On: 03/20/2020 04:01   Review of Systems  Constitutional: Negative for fever.  Gastrointestinal: Positive for abdominal pain. Negative for diarrhea and vomiting.  Genitourinary: Positive for vaginal bleeding and vaginal discharge.   Physical Exam   Blood pressure 113/65, pulse 73, temperature 98.4 F (36.9 C), temperature source Oral, resp. rate 18, height 5\' 8"  (1.727 m), weight 76.4 kg, last menstrual period 01/30/2020, SpO2 100 %.  Physical Exam Constitutional:      General: She is not in acute distress.    Appearance: Normal appearance. She is not ill-appearing, toxic-appearing or diaphoretic.  Abdominal:     Tenderness: There is generalized abdominal tenderness. There is no guarding or rebound.  Genitourinary:    Comments: Vagina - Small-moderate amount of pink vaginal discharge, no odor  Cervix - No contact bleeding, no active bleeding  Bimanual exam: deferred  GC/Chlam, wet prep done Chaperone present for exam.  Skin:    General: Skin is warm.  Neurological:     Mental Status: She is alert and oriented to person, place, and time.  Psychiatric:        Behavior: Behavior normal.    MAU Course  Procedures  None  MDM  A positive blood type.  Wet prep & GC HIV, CBC, Hcg, ABO 02/01/2020 OB transvaginal  Discussed results in detail.   Assessment and Plan   A:  1. Bacterial vaginosis   2. Vaginal bleeding during pregnancy   3. [redacted] weeks gestation of pregnancy      P:  Discharge home in stable condition Pelvic rest  Rx: Flagyl Keep your appointment with OB this week Return to MAU if symptoms worsen  Saxon Barich, [place], NP 03/20/2020 4:48 AM

## 2020-03-20 NOTE — Discharge Instructions (Signed)

## 2020-03-20 NOTE — ED Provider Notes (Signed)
84:33 AM 29 year old G2P1 female, currently pregnant, presents to the emergency department for evaluation of abdominal cramping.  She has had cramping in her suprapubic abdomen, back, bilateral thighs x1 week.  Began having vaginal spotting of a pink fluid x2 days.  Has a history of PCOS and wanted to make sure she is not having a miscarriage.  [facility]  Stable for transfer to MAU.   Antony Madura, PA-C 03/20/20 0216    Tegeler, Canary Brim, MD 03/20/20 [phone]

## 2020-03-20 NOTE — MAU Note (Addendum)
Pt transferred from the ED with c/o vaginal bleeding and abdominal pain that is in the lower abd radiating to her lower back and groin area.  Pt reports that she started having vaginal spotting yesterday and now she is having bloody show when she wipes. Pt states LMP 01/30/20.     Addison Naegeli, RN

## 2020-03-20 NOTE — MAU Note (Signed)
RN discussed after visit summary and discharge instructions. Patient reports & verbalizes understanding. Technical error printing AVS. Patient discharged at 0501 on 03/20/20.

## 2020-03-20 NOTE — ED Triage Notes (Signed)
Pt said home pregnancy test ands is about 7 weeks and today she began to bleed off and on, nothing heavy. Spotting off and on and cramping.Not been to OBGYN yet appointment is Monday. HX of PCOS

## 2020-03-21 LAB — GC/CHLAMYDIA PROBE AMP (~~LOC~~) NOT AT ARMC
Chlamydia: NEGATIVE
Comment: NEGATIVE
Comment: NORMAL
Neisseria Gonorrhea: NEGATIVE

## 2020-03-21 LAB — ABO/RH: ABO/RH(D): A POS

## 2020-03-28 LAB — OB RESULTS CONSOLE HIV ANTIBODY (ROUTINE TESTING): HIV: NONREACTIVE

## 2020-03-28 LAB — OB RESULTS CONSOLE HEPATITIS B SURFACE ANTIGEN: Hepatitis B Surface Ag: NEGATIVE

## 2020-03-28 LAB — OB RESULTS CONSOLE RUBELLA ANTIBODY, IGM: Rubella: IMMUNE

## 2020-04-09 DIAGNOSIS — E282 Polycystic ovarian syndrome: Secondary | ICD-10-CM

## 2020-04-09 HISTORY — DX: Polycystic ovarian syndrome: E28.2

## 2020-05-14 ENCOUNTER — Encounter (HOSPITAL_COMMUNITY): Payer: Self-pay | Admitting: Obstetrics and Gynecology

## 2020-05-14 ENCOUNTER — Inpatient Hospital Stay (HOSPITAL_COMMUNITY)
Admission: AD | Admit: 2020-05-14 | Discharge: 2020-05-14 | Disposition: A | Discharge status: Home / Self Care | Payer: BLUE CROSS/BLUE SHIELD | Attending: Obstetrics and Gynecology | Admitting: Obstetrics and Gynecology

## 2020-05-14 DIAGNOSIS — O26892 Other specified pregnancy related conditions, second trimester: Secondary | ICD-10-CM | POA: Insufficient documentation

## 2020-05-14 DIAGNOSIS — Z20822 Contact with and (suspected) exposure to covid-19: Secondary | ICD-10-CM | POA: Insufficient documentation

## 2020-05-14 DIAGNOSIS — R051 Acute cough: Secondary | ICD-10-CM | POA: Insufficient documentation

## 2020-05-14 DIAGNOSIS — Z3A15 15 weeks gestation of pregnancy: Secondary | ICD-10-CM

## 2020-05-14 DIAGNOSIS — M549 Dorsalgia, unspecified: Secondary | ICD-10-CM | POA: Diagnosis not present

## 2020-05-14 DIAGNOSIS — O99891 Other specified diseases and conditions complicating pregnancy: Secondary | ICD-10-CM | POA: Diagnosis not present

## 2020-05-14 DIAGNOSIS — R102 Pelvic and perineal pain: Secondary | ICD-10-CM | POA: Diagnosis not present

## 2020-05-14 DIAGNOSIS — N949 Unspecified condition associated with female genital organs and menstrual cycle: Secondary | ICD-10-CM

## 2020-05-14 DIAGNOSIS — R109 Unspecified abdominal pain: Secondary | ICD-10-CM | POA: Diagnosis present

## 2020-05-14 LAB — WET PREP, GENITAL
Clue Cells Wet Prep HPF POC: NONE SEEN
Sperm: NONE SEEN
Trich, Wet Prep: NONE SEEN
Yeast Wet Prep HPF POC: NONE SEEN

## 2020-05-14 LAB — URINALYSIS, ROUTINE W REFLEX MICROSCOPIC
Bilirubin Urine: NEGATIVE
Glucose, UA: NEGATIVE mg/dL
Hgb urine dipstick: NEGATIVE
Ketones, ur: NEGATIVE mg/dL
Leukocytes,Ua: NEGATIVE
Nitrite: NEGATIVE
Protein, ur: NEGATIVE mg/dL
Specific Gravity, Urine: 1.004 — ABNORMAL LOW (ref 1.005–1.030)
pH: 6 (ref 5.0–8.0)

## 2020-05-14 LAB — SARS CORONAVIRUS 2 BY RT PCR (HOSPITAL ORDER, PERFORMED IN ~~LOC~~ HOSPITAL LAB): SARS Coronavirus 2: NEGATIVE

## 2020-05-14 MED ORDER — CYCLOBENZAPRINE HCL 5 MG PO TABS
10.0000 mg | ORAL_TABLET | Freq: Once | ORAL | Status: AC
  Administered 2020-05-14: 10 mg via ORAL
  Filled 2020-05-14: qty 2

## 2020-05-14 MED ORDER — CYCLOBENZAPRINE HCL 10 MG PO TABS: 10.0000 mg | ORAL_TABLET | Freq: Two times a day (BID) | ORAL | 0 refills | Status: DC | PRN

## 2020-05-14 NOTE — MAU Note (Signed)
Pt reports to mau with c/o constant lower abd pain that radiates to her back and down her thighs since last night.  Pt also endorses headache and cough.  Pt denies known exposure to covid, but states she has not had a covid vaccine.  Denies vag bleeding.

## 2020-05-14 NOTE — Discharge Instructions (Signed)

## 2020-05-14 NOTE — MAU Provider Note (Signed)
History     CSN: [id number]  Arrival date and time: 05/14/20 1106   Event Date/Time   First Provider Initiated Contact with Patient 05/14/20 1215      Chief Complaint  Patient presents with  . Back Pain  . Abdominal Pain  . Headache  . Leg Pain  . Cough   April Osborn is a 30 y.o. G2P1001 at [redacted]w[redacted]d who presents today with back pain, cramping and cough/shortness of breath. She has had the cough and shortness of breath for about one week, but she thought it was pregnancy related. However, now she is having back pain that radiates down her legs and cramping as well. She denies any VB or  LOF. Patient does not have any known covid exposures. She has not had covid vaccination.   Back Pain This is a new problem. The current episode started yesterday. The problem occurs constantly. The problem is unchanged. The pain is present in the lumbar spine. The pain radiates to the left thigh and right thigh. The pain is at a severity of 7/10. Associated symptoms include pelvic pain. Pertinent negatives include no dysuria or fever. Risk factors include pregnancy. She has tried nothing for the symptoms.  Pelvic Pain The patient's primary symptoms include pelvic pain. The patient's pertinent negatives include no vaginal discharge. This is a new problem. The current episode started yesterday. The problem occurs constantly. The problem has been unchanged. Associated symptoms include back pain. Pertinent negatives include no chills, dysuria, fever, nausea or vomiting. The vaginal discharge was normal. There has been no bleeding.    OB History    Gravida  2   Para  1   Term  1   Preterm      AB      Living  1     SAB      IAB      Ectopic      Multiple      Live Births              History reviewed. No pertinent past medical history.  Past Surgical History:  Procedure Laterality Date  . NO PAST SURGERIES      Family History  Problem Relation Age of Onset  . Cancer  Maternal Grandmother     Social History   Tobacco Use  . Smoking status: Never Smoker  . Smokeless tobacco: Never Used  Vaping Use  . Vaping Use: Never used  Substance Use Topics  . Alcohol use: Never  . Drug use: Never    Allergies: No Known Allergies  Medications Prior to Admission  Medication Sig Dispense Refill Last Dose  . prenatal vitamin w/FE, FA (PRENATAL 1 + 1) 27-1 MG TABS tablet Take 1 tablet by mouth daily at 12 noon.   05/13/2020 at Unknown time    Review of Systems  Constitutional: Negative for chills and fever.  Gastrointestinal: Negative for nausea and vomiting.  Genitourinary: Positive for pelvic pain. Negative for dysuria, vaginal bleeding and vaginal discharge.  Musculoskeletal: Positive for back pain.   Physical Exam   Blood pressure 124/73, pulse 82, temperature 98.4 F (36.9 C), temperature source Oral, resp. rate 16, last menstrual period 01/30/2020, SpO2 99 %.  Physical Exam Vitals and nursing note reviewed.  Constitutional:      General: She is not in acute distress. HENT:     Head: Normocephalic.  Eyes:     Pupils: Pupils are equal, round, and reactive to light.  Cardiovascular:  Rate and Rhythm: Normal rate.  Pulmonary:     Effort: Pulmonary effort is normal.  Abdominal:     Palpations: Abdomen is soft.     Tenderness: There is no abdominal tenderness.  Genitourinary:    Comments: Fundus: 15 weeks size +FHT 153 with doppler  Cervix: closed/thick  Skin:    General: Skin is warm and dry.  Neurological:     Mental Status: She is alert and oriented to person, place, and time.  Psychiatric:        Mood and Affect: Mood normal.        Behavior: Behavior normal.     Results for orders placed or performed during the hospital encounter of 05/14/20 (from the past 24 hour(s))  Urinalysis, Routine w reflex microscopic Urine, Clean Catch     Status: Abnormal   Collection Time: 05/14/20 11:41 AM  Result Value Ref Range   Color, Urine  STRAW (A) YELLOW   APPearance CLEAR CLEAR   Specific Gravity, Urine 1.004 (L) 1.005 - 1.030   pH 6.0 5.0 - 8.0   Glucose, UA NEGATIVE NEGATIVE mg/dL   Hgb urine dipstick NEGATIVE NEGATIVE   Bilirubin Urine NEGATIVE NEGATIVE   Ketones, ur NEGATIVE NEGATIVE mg/dL   Protein, ur NEGATIVE NEGATIVE mg/dL   Nitrite NEGATIVE NEGATIVE   Leukocytes,Ua NEGATIVE NEGATIVE  SARS Coronavirus 2 by RT PCR (hospital order, performed in [facility] hospital lab) Nasopharyngeal Nasopharyngeal Swab     Status: None   Collection Time: 05/14/20 12:34 PM   Specimen: Nasopharyngeal Swab  Result Value Ref Range   SARS Coronavirus 2 NEGATIVE NEGATIVE  Wet prep, genital     Status: Abnormal   Collection Time: 05/14/20  1:50 PM  Result Value Ref Range   Yeast Wet Prep HPF POC NONE SEEN NONE SEEN   Trich, Wet Prep NONE SEEN NONE SEEN   Clue Cells Wet Prep HPF POC NONE SEEN NONE SEEN   WBC, Wet Prep HPF POC RARE (A) NONE SEEN   Sperm NONE SEEN      MAU Course  Procedures  MDM  Wet prep, GC/CT, UA, Covid collected Patient given flexeril for pain  Covid negative, UA negative, Wet prep negative, GC/CT pending DW patient that pain is likely related to round ligament pain. Comfort measures reviewed.   Assessment and Plan   1. Round ligament pain   2. [redacted] weeks gestation of pregnancy    DC home Comfort measures reviewed  2nd Trimester precautions  PTL precautions  ZO:[id number]  Return to MAU as needed FU with OB as planned   Follow-up Information    [facility] Follow up.   Contact information: [address] [place] [address] [phone]              Thressa Sheller DNP, CNM  05/14/20  2:34 PM

## 2020-05-16 LAB — GC/CHLAMYDIA PROBE AMP (~~LOC~~) NOT AT ARMC
Chlamydia: NEGATIVE
Comment: NEGATIVE
Comment: NORMAL
Neisseria Gonorrhea: NEGATIVE

## 2020-09-22 ENCOUNTER — Encounter (HOSPITAL_COMMUNITY): Payer: Self-pay | Admitting: Obstetrics and Gynecology

## 2020-09-22 ENCOUNTER — Other Ambulatory Visit: Payer: Self-pay | Admitting: Advanced Practice Midwife

## 2020-09-22 ENCOUNTER — Other Ambulatory Visit: Payer: Self-pay

## 2020-09-22 ENCOUNTER — Inpatient Hospital Stay (HOSPITAL_COMMUNITY)
Admission: AD | Admit: 2020-09-22 | Discharge: 2020-09-22 | Disposition: A | Discharge status: Home / Self Care | Payer: BLUE CROSS/BLUE SHIELD | Attending: Obstetrics and Gynecology | Admitting: Obstetrics and Gynecology

## 2020-09-22 DIAGNOSIS — O26893 Other specified pregnancy related conditions, third trimester: Secondary | ICD-10-CM | POA: Insufficient documentation

## 2020-09-22 DIAGNOSIS — R102 Pelvic and perineal pain: Secondary | ICD-10-CM | POA: Insufficient documentation

## 2020-09-22 DIAGNOSIS — Z3689 Encounter for other specified antenatal screening: Secondary | ICD-10-CM

## 2020-09-22 DIAGNOSIS — Z3A33 33 weeks gestation of pregnancy: Secondary | ICD-10-CM | POA: Diagnosis not present

## 2020-09-22 HISTORY — DX: Other specified health status: Z78.9

## 2020-09-22 LAB — URINALYSIS, ROUTINE W REFLEX MICROSCOPIC
Bacteria, UA: NONE SEEN
Bilirubin Urine: NEGATIVE
Glucose, UA: NEGATIVE mg/dL
Hgb urine dipstick: NEGATIVE
Ketones, ur: NEGATIVE mg/dL
Nitrite: NEGATIVE
Protein, ur: NEGATIVE mg/dL
Specific Gravity, Urine: 1.01 (ref 1.005–1.030)
pH: 7 (ref 5.0–8.0)

## 2020-09-22 MED ORDER — ACETAMINOPHEN 325 MG PO TABS: 650.0000 mg | ORAL_TABLET | Freq: Four times a day (QID) | ORAL | 0 refills | Status: AC | PRN

## 2020-09-22 NOTE — MAU Provider Note (Signed)
History     CSN: [id number]  Arrival date and time: 09/22/20 1200   Event Date/Time   First Provider Initiated Contact with Patient 09/22/20 1240      Chief Complaint  Patient presents with   Pelvic Pain   HPI April Osborn is a 30 y.o. G2P1001 at [redacted]w[redacted]d who presents to MAU with chief complaint of pelvic pain. This is a recurrent problem, onset in second trimester and worsening over time. Pain is located bilaterally in her lower abdomen. Pain radiates towards her iliac crests. Pain score is 9/10.  Patient reports previously attempting management with Flexeril, Tylenol and a maternity belt. She did not feel that any of these interventions were helpful and so discontinued use.  She denies vaginal bleeding, leaking of fluid, decreased fetal movement, fever, falls, or recent illness.   Patient receives care with [facility].  OB History     Gravida  2   Para  1   Term  1   Preterm      AB      Living  1      SAB      IAB      Ectopic      Multiple      Live Births              Past Medical History:  Diagnosis Date   Medical history non-contributory     Past Surgical History:  Procedure Laterality Date   NO PAST SURGERIES      Family History  Problem Relation Age of Onset   Cancer Maternal Grandmother     Social History   Tobacco Use   Smoking status: Never   Smokeless tobacco: Never  Vaping Use   Vaping Use: Never used  Substance Use Topics   Alcohol use: Never   Drug use: Never    Allergies: No Known Allergies  Medications Prior to Admission  Medication Sig Dispense Refill Last Dose   prenatal vitamin w/FE, FA (PRENATAL 1 + 1) 27-1 MG TABS tablet Take 1 tablet by mouth daily at 12 noon.   Past Week   cyclobenzaprine (FLEXERIL) 10 MG tablet Take 1 tablet (10 mg total) by mouth 2 (two) times daily as needed for muscle spasms. 20 tablet 0     Review of Systems  Gastrointestinal:  Negative for abdominal pain.   Genitourinary:  Positive for pelvic pain. Negative for vaginal bleeding.  All other systems reviewed and are negative. Physical Exam   Blood pressure 128/76, pulse 90, temperature 98 F (36.7 C), resp. rate 18, height 5\' 8"  (1.727 m), weight 97.5 kg, last menstrual period 01/30/2020, SpO2 98 %.  Physical Exam Vitals and nursing note reviewed. Exam conducted with a chaperone present.  Constitutional:      Appearance: Normal appearance.  Cardiovascular:     Rate and Rhythm: Normal rate and regular rhythm.     Pulses: Normal pulses.     Heart sounds: Normal heart sounds.  Pulmonary:     Effort: Pulmonary effort is normal.     Breath sounds: Normal breath sounds.  Abdominal:     Tenderness: There is no abdominal tenderness.     Comments: Gravid  Skin:    Capillary Refill: Capillary refill takes less than 2 seconds.  Neurological:     Mental Status: She is alert and oriented to person, place, and time.  Psychiatric:        Mood and Affect: Mood normal.  Behavior: Behavior normal.        Thought Content: Thought content normal.        Judgment: Judgment normal.    MAU Course  Procedures  --Patient declined pain medication in MAU.  --Patient pushed call bell at 1315 and requested discharge from MAU --Reactive tracing: baseline 145, mod var, + accels, no decels --Toco: UI --Round ligament and pubic symphysis pain. Discussed typical success with multimodal approach. Advised reintroduction Flexeril and Tylenol, wear maternity belt low on pelvis to provide pressure at iliac crests. Consider referral to Physical Therapy.  Patient Vitals for the past 24 hrs:  BP Temp Pulse Resp SpO2 Height Weight  09/22/20 1330 124/73 -- 91 18 -- -- --  09/22/20 1245 -- -- -- -- 98 % -- --  09/22/20 1241 128/76 -- 90 -- -- -- --  09/22/20 1240 -- -- -- -- 99 % -- --  09/22/20 1235 -- -- -- -- 98 % -- --  09/22/20 1215 131/78 98 F (36.7 C) 95 18 -- 5\' 8"  (1.727 m) 97.5 kg   Orders Placed  This Encounter  Procedures   Urinalysis, Routine w reflex microscopic Urine, Clean Catch   Discharge patient   Results for orders placed or performed during the hospital encounter of 09/22/20 (from the past 24 hour(s))  Urinalysis, Routine w reflex microscopic Urine, Clean Catch     Status: Abnormal   Collection Time: 09/22/20 12:36 PM  Result Value Ref Range   Color, Urine YELLOW YELLOW   APPearance HAZY (A) CLEAR   Specific Gravity, Urine 1.010 1.005 - 1.030   pH 7.0 5.0 - 8.0   Glucose, UA NEGATIVE NEGATIVE mg/dL   Hgb urine dipstick NEGATIVE NEGATIVE   Bilirubin Urine NEGATIVE NEGATIVE   Ketones, ur NEGATIVE NEGATIVE mg/dL   Protein, ur NEGATIVE NEGATIVE mg/dL   Nitrite NEGATIVE NEGATIVE   Leukocytes,Ua SMALL (A) NEGATIVE   RBC / HPF 0-5 0 - 5 RBC/hpf   WBC, UA 0-5 0 - 5 WBC/hpf   Bacteria, UA NONE SEEN NONE SEEN   Squamous Epithelial / LPF 11-20 0 - 5   Mucus PRESENT    Assessment and Plan  --30 y.o. G2P1001 at [redacted]w[redacted]d  --Reactive tracing --Round ligament and pubic symphysis pain --Given maternity belt, other interventions declined by patient --Discharge home in stable condition  [redacted]w[redacted]d, CNM 09/22/2020, 8:14 PM

## 2020-09-22 NOTE — MAU Note (Signed)
Reports she started feeling increased pelvic pressure that started in the middle of the night. Has gotten worse through the day. Pain is constant  and feels heavy and sharp

## 2020-10-05 LAB — OB RESULTS CONSOLE GBS: GBS: POSITIVE

## 2020-10-14 ENCOUNTER — Inpatient Hospital Stay (HOSPITAL_COMMUNITY): Payer: Medicaid Other | Admitting: Anesthesiology

## 2020-10-14 ENCOUNTER — Encounter (HOSPITAL_COMMUNITY): Payer: Self-pay | Admitting: Obstetrics and Gynecology

## 2020-10-14 ENCOUNTER — Inpatient Hospital Stay (HOSPITAL_COMMUNITY)
Admission: AD | Admit: 2020-10-14 | Discharge: 2020-10-16 | DRG: 807 | Disposition: A | Discharge status: Home / Self Care | Payer: Medicaid Other | Attending: Obstetrics and Gynecology | Admitting: Obstetrics and Gynecology

## 2020-10-14 ENCOUNTER — Other Ambulatory Visit: Payer: Self-pay

## 2020-10-14 DIAGNOSIS — Z20822 Contact with and (suspected) exposure to covid-19: Secondary | ICD-10-CM | POA: Diagnosis present

## 2020-10-14 DIAGNOSIS — O99824 Streptococcus B carrier state complicating childbirth: Secondary | ICD-10-CM | POA: Diagnosis present

## 2020-10-14 DIAGNOSIS — O26893 Other specified pregnancy related conditions, third trimester: Secondary | ICD-10-CM | POA: Diagnosis present

## 2020-10-14 DIAGNOSIS — Z3A36 36 weeks gestation of pregnancy: Secondary | ICD-10-CM

## 2020-10-14 DIAGNOSIS — Z3689 Encounter for other specified antenatal screening: Secondary | ICD-10-CM

## 2020-10-14 DIAGNOSIS — O329XX0 Maternal care for malpresentation of fetus, unspecified, not applicable or unspecified: Secondary | ICD-10-CM

## 2020-10-14 DIAGNOSIS — O4202 Full-term premature rupture of membranes, onset of labor within 24 hours of rupture: Secondary | ICD-10-CM

## 2020-10-14 LAB — RESP PANEL BY RT-PCR (FLU A&B, COVID) ARPGX2
Influenza A by PCR: NEGATIVE
Influenza B by PCR: NEGATIVE
SARS Coronavirus 2 by RT PCR: NEGATIVE

## 2020-10-14 LAB — RPR: RPR Ser Ql: NONREACTIVE

## 2020-10-14 LAB — TYPE AND SCREEN
ABO/RH(D): A POS
Antibody Screen: NEGATIVE

## 2020-10-14 LAB — CBC
HCT: 39.9 % (ref 36.0–46.0)
Hemoglobin: 13 g/dL (ref 12.0–15.0)
MCH: 30.2 pg (ref 26.0–34.0)
MCHC: 32.6 g/dL (ref 30.0–36.0)
MCV: 92.6 fL (ref 80.0–100.0)
Platelets: 223 10*3/uL (ref 150–400)
RBC: 4.31 MIL/uL (ref 3.87–5.11)
RDW: 12.9 % (ref 11.5–15.5)
WBC: 13 10*3/uL — ABNORMAL HIGH (ref 4.0–10.5)
nRBC: 0 % (ref 0.0–0.2)

## 2020-10-14 MED ORDER — ACETAMINOPHEN 325 MG PO TABS
650.0000 mg | ORAL_TABLET | ORAL | Status: DC | PRN
  Administered 2020-10-15 (×2): 650 mg via ORAL
  Filled 2020-10-14 (×3): qty 2

## 2020-10-14 MED ORDER — LACTATED RINGERS IV SOLN: 500.0000 mL | Freq: Once | INTRAVENOUS | Status: DC

## 2020-10-14 MED ORDER — PHENYLEPHRINE 40 MCG/ML (10ML) SYRINGE FOR IV PUSH (FOR BLOOD PRESSURE SUPPORT): 80.0000 ug | PREFILLED_SYRINGE | INTRAVENOUS | Status: DC | PRN

## 2020-10-14 MED ORDER — TETANUS-DIPHTH-ACELL PERTUSSIS 5-2.5-18.5 LF-MCG/0.5 IM SUSY: 0.5000 mL | PREFILLED_SYRINGE | Freq: Once | INTRAMUSCULAR | Status: DC

## 2020-10-14 MED ORDER — WITCH HAZEL-GLYCERIN EX PADS
1.0000 "application " | MEDICATED_PAD | CUTANEOUS | Status: DC | PRN
  Administered 2020-10-14: 1 via TOPICAL

## 2020-10-14 MED ORDER — DIBUCAINE (PERIANAL) 1 % EX OINT: 1.0000 "application " | TOPICAL_OINTMENT | CUTANEOUS | Status: DC | PRN

## 2020-10-14 MED ORDER — OXYCODONE-ACETAMINOPHEN 5-325 MG PO TABS: 2.0000 | ORAL_TABLET | ORAL | Status: DC | PRN

## 2020-10-14 MED ORDER — LACTATED RINGERS IV SOLN: 500.0000 mL | INTRAVENOUS | Status: DC | PRN

## 2020-10-14 MED ORDER — ACETAMINOPHEN 325 MG PO TABS: 650.0000 mg | ORAL_TABLET | ORAL | Status: DC | PRN

## 2020-10-14 MED ORDER — PENICILLIN G POT IN DEXTROSE 60000 UNIT/ML IV SOLN
3.0000 10*6.[IU] | INTRAVENOUS | Status: DC
  Administered 2020-10-14: 3 10*6.[IU] via INTRAVENOUS
  Filled 2020-10-14: qty 50

## 2020-10-14 MED ORDER — OXYCODONE HCL 5 MG PO TABS: 10.0000 mg | ORAL_TABLET | ORAL | Status: DC | PRN

## 2020-10-14 MED ORDER — ZOLPIDEM TARTRATE 5 MG PO TABS: 5.0000 mg | ORAL_TABLET | Freq: Every evening | ORAL | Status: DC | PRN

## 2020-10-14 MED ORDER — PRENATAL MULTIVITAMIN CH
1.0000 | ORAL_TABLET | Freq: Every day | ORAL | Status: DC
  Administered 2020-10-15 – 2020-10-16 (×2): 1 via ORAL
  Filled 2020-10-14 (×2): qty 1

## 2020-10-14 MED ORDER — FENTANYL-BUPIVACAINE-NACL 0.5-0.125-0.9 MG/250ML-% EP SOLN
EPIDURAL | Status: DC | PRN
  Administered 2020-10-14: 12 mL/h via EPIDURAL

## 2020-10-14 MED ORDER — DIPHENHYDRAMINE HCL 25 MG PO CAPS: 25.0000 mg | ORAL_CAPSULE | Freq: Four times a day (QID) | ORAL | Status: DC | PRN

## 2020-10-14 MED ORDER — EPHEDRINE 5 MG/ML INJ
10.0000 mg | INTRAVENOUS | Status: DC | PRN
  Administered 2020-10-14: 10 mg via INTRAVENOUS

## 2020-10-14 MED ORDER — OXYTOCIN BOLUS FROM INFUSION
333.0000 mL | Freq: Once | INTRAVENOUS | Status: AC
  Administered 2020-10-14: 333 mL via INTRAVENOUS

## 2020-10-14 MED ORDER — FENTANYL-BUPIVACAINE-NACL 0.5-0.125-0.9 MG/250ML-% EP SOLN
12.0000 mL/h | EPIDURAL | Status: DC | PRN
  Filled 2020-10-14: qty 250

## 2020-10-14 MED ORDER — LACTATED RINGERS IV SOLN: INTRAVENOUS | Status: DC

## 2020-10-14 MED ORDER — LIDOCAINE HCL (PF) 1 % IJ SOLN: 30.0000 mL | INTRAMUSCULAR | Status: DC | PRN

## 2020-10-14 MED ORDER — SENNOSIDES-DOCUSATE SODIUM 8.6-50 MG PO TABS
2.0000 | ORAL_TABLET | ORAL | Status: DC
  Administered 2020-10-16: 2 via ORAL
  Filled 2020-10-14: qty 2

## 2020-10-14 MED ORDER — SODIUM CHLORIDE 0.9 % IV SOLN
5.0000 10*6.[IU] | Freq: Once | INTRAVENOUS | Status: AC
  Administered 2020-10-14: 5 10*6.[IU] via INTRAVENOUS
  Filled 2020-10-14: qty 5

## 2020-10-14 MED ORDER — OXYCODONE HCL 5 MG PO TABS: 5.0000 mg | ORAL_TABLET | ORAL | Status: DC | PRN

## 2020-10-14 MED ORDER — FLEET ENEMA 7-19 GM/118ML RE ENEM: 1.0000 | ENEMA | RECTAL | Status: DC | PRN

## 2020-10-14 MED ORDER — COCONUT OIL OIL
1.0000 "application " | TOPICAL_OIL | Status: DC | PRN
  Administered 2020-10-14: 1 via TOPICAL

## 2020-10-14 MED ORDER — SOD CITRATE-CITRIC ACID 500-334 MG/5ML PO SOLN: 30.0000 mL | ORAL | Status: DC | PRN

## 2020-10-14 MED ORDER — OXYTOCIN-SODIUM CHLORIDE 30-0.9 UT/500ML-% IV SOLN
2.5000 [IU]/h | INTRAVENOUS | Status: DC
  Filled 2020-10-14: qty 500

## 2020-10-14 MED ORDER — EPHEDRINE 5 MG/ML INJ
10.0000 mg | INTRAVENOUS | Status: DC | PRN
  Filled 2020-10-14: qty 10

## 2020-10-14 MED ORDER — IBUPROFEN 600 MG PO TABS
600.0000 mg | ORAL_TABLET | Freq: Four times a day (QID) | ORAL | Status: DC
  Administered 2020-10-14 – 2020-10-16 (×8): 600 mg via ORAL
  Filled 2020-10-14 (×8): qty 1

## 2020-10-14 MED ORDER — ONDANSETRON HCL 4 MG/2ML IJ SOLN: 4.0000 mg | INTRAMUSCULAR | Status: DC | PRN

## 2020-10-14 MED ORDER — OXYCODONE-ACETAMINOPHEN 5-325 MG PO TABS: 1.0000 | ORAL_TABLET | ORAL | Status: DC | PRN

## 2020-10-14 MED ORDER — LIDOCAINE HCL (PF) 1 % IJ SOLN
INTRAMUSCULAR | Status: DC | PRN
  Administered 2020-10-14: 5 mL via EPIDURAL

## 2020-10-14 MED ORDER — SIMETHICONE 80 MG PO CHEW: 80.0000 mg | CHEWABLE_TABLET | ORAL | Status: DC | PRN

## 2020-10-14 MED ORDER — DIPHENHYDRAMINE HCL 50 MG/ML IJ SOLN: 12.5000 mg | INTRAMUSCULAR | Status: DC | PRN

## 2020-10-14 MED ORDER — BENZOCAINE-MENTHOL 20-0.5 % EX AERO
1.0000 "application " | INHALATION_SPRAY | CUTANEOUS | Status: DC | PRN
  Administered 2020-10-14: 1 via TOPICAL
  Filled 2020-10-14: qty 56

## 2020-10-14 MED ORDER — ONDANSETRON HCL 4 MG/2ML IJ SOLN: 4.0000 mg | Freq: Four times a day (QID) | INTRAMUSCULAR | Status: DC | PRN

## 2020-10-14 MED ORDER — ONDANSETRON HCL 4 MG PO TABS: 4.0000 mg | ORAL_TABLET | ORAL | Status: DC | PRN

## 2020-10-14 MED ORDER — FENTANYL CITRATE (PF) 100 MCG/2ML IJ SOLN: 50.0000 ug | INTRAMUSCULAR | Status: DC | PRN

## 2020-10-14 NOTE — Progress Notes (Signed)
Pt informed that the ultrasound is considered a limited OB ultrasound and is not intended to be a complete ultrasound exam.  Patient also informed that the ultrasound is not being completed with the intent of assessing for fetal or placental anomalies or any pelvic abnormalities.  Explained that the purpose of today's ultrasound is to assess for  presentation.  Patient acknowledges the purpose of the exam and the limitations of the study.    Cephalic  April Greis, NP   

## 2020-10-14 NOTE — Lactation Note (Signed)
This note was copied from a baby's chart. Lactation Consultation Note  Patient Name: April Osborn [id number] Date: 10/14/2020 Reason for consult: Initial assessment;Late-preterm 34-36.6wks Age:28 hours Infant had one void while LC was in the room.  Per mom, infant has latched briefly  maybe twice for 2 to 3 minutes each time,  infant was licking and tasting then falling asleep, LC explain this is normal for some LPTI. Mom is supplementing infant with donor breast milk, infant was given 10 mls using slow flow bottle nipple at 1756 pm, LC did not observe latch.  LC discussed LPTI feeding policy with mom, doing STS with infant, keeping hat on, feeding infant 8 to 12 times within 24 hours, don't make infant to wait to feed, wake baby if infant is not cuing to feed, do not feed baby longer than 30 minutes within the feeding. LC discussed infant's input and output with mom. Mom shown how to use DEBP & how to disassemble, clean, & reassemble parts.  Mom made aware of O/P services, breastfeeding support groups, community resources, and our phone # for post-discharge questions.   Mom's plan: 1- Mom will follow LPTI feeding policy ( green sheet). 2- Mom will ask for assistance with latch if needed, limiting total feeding to 30 minutes or less, with supplement.  3- Mom will continue to supplement infant with donor breast milk every feeding on Day 1, mom will continue to offer 10 mls per feeding, due infant not latching well at the breast. 4- Mom will continue to use DEBP every 3 hours for 15 minutes on initial setting.  Maternal Data Has patient been taught Hand Expression?: Yes Does the patient have breastfeeding experience prior to this delivery?: Yes How long did the patient breastfeed?: Per mom, she BF her 52 year old son for 2 years.  Feeding Mother's Current Feeding Choice: Breast Milk and Donor Milk Nipple Type: Extra Slow Flow  LATCH Score                    Lactation Tools  Discussed/Used Tools: Pump Breast pump type: Double-Electric Breast Pump Pump Education: Setup, frequency, and cleaning;Milk Storage Reason for Pumping: Infant is LPTI, limit feedings maybe 2 to 3 minutes ( poor latcher) Pumping frequency: Mom knows to pump every 3 hours for 15 minutes on inital setting.  Interventions Interventions: Breast feeding basics reviewed;Skin to skin;Hand express;Education;DEBP  Discharge Pump: DEBP WIC Program: No  Consult Status Consult Status: Follow-up Date: 10/15/20 Follow-up type: In-patient    Danelle Earthly 10/14/2020, 6:49 PM

## 2020-10-14 NOTE — H&P (Signed)
April Osborn is a 30 y.o. female presenting for UCs. SROM in MAU. OB History     Gravida  2   Para  1   Term  1   Preterm      AB      Living  1      SAB      IAB      Ectopic      Multiple      Live Births             Past Medical History:  Diagnosis Date   Medical history non-contributory    Past Surgical History:  Procedure Laterality Date   NO PAST SURGERIES     Family History: family history includes Cancer in her maternal grandmother. Social History:  reports that she has never smoked. She has never used smokeless tobacco. She reports that she does not drink alcohol and does not use drugs.     Maternal Diabetes: No Genetic Screening: Normal Maternal Ultrasounds/Referrals: Normal Fetal Ultrasounds or other Referrals:  None Maternal Substance Abuse:  No Significant Maternal Medications:  None Significant Maternal Lab Results:  Group B Strep positive Other Comments:  None  Review of Systems  Constitutional:  Negative for fever.  Eyes:  Negative for visual disturbance.  Gastrointestinal:  Negative for abdominal pain.  Neurological:  Negative for headaches.  Maternal Medical History:  Reason for admission: Contractions.   Fetal activity: Perceived fetal activity is normal.    Dilation: 5 Effacement (%): 100 Station: -2 Exam by:: Nella Botsford md Blood pressure 115/69, pulse (!) 105, temperature 98.4 F (36.9 C), temperature source Oral, resp. rate 16, height 5\' 8"  (1.727 m), weight 99.8 kg, last menstrual period 01/30/2020, SpO2 100 %. Maternal Exam:  Abdomen: Fetal presentation: vertex   Fetal Exam Fetal State Assessment: Category I - tracings are normal.  Physical Exam Cardiovascular:     Rate and Rhythm: Normal rate.  Pulmonary:     Effort: Pulmonary effort is normal.    Prenatal labs: ABO, Rh: --/--/A POS (07/08 [birth year]-02-1982) Antibody: NEG (07/08 0817) Rubella: Immune (12/20 0000) RPR: NON REACTIVE (07/08 0825)  HBsAg: Negative (12/20  0000)  HIV: Non-reactive (12/20 0000)  GBS: Positive/-- (06/29 0000)   Assessment/Plan: 30 yo G2P1 @ 36 6/7 weeks in active labor Anticipate vaginal delivery ATB per [facility]BS protocol   08[birth year]2002 II 10/14/2020, 12:24 PM

## 2020-10-14 NOTE — Anesthesia Procedure Notes (Signed)
Epidural Patient location during procedure: OB Start time: 10/14/2020 12:00 PM End time: 10/14/2020 12:14 PM  Staffing Anesthesiologist: Trevor Iha, MD Performed: anesthesiologist   Preanesthetic Checklist Completed: patient identified, IV checked, site marked, risks and benefits discussed, surgical consent, monitors and equipment checked, pre-op evaluation and timeout performed  Epidural Patient position: sitting Prep: DuraPrep and site prepped and draped Patient monitoring: continuous pulse ox and blood pressure Approach: midline Location: L2-L3 Injection technique: LOR air  Needle:  Needle type: Tuohy  Needle gauge: 17 G Needle length: 9 cm and 9 Needle insertion depth: 5 cm cm Catheter type: closed end flexible Catheter size: 19 Gauge Catheter at skin depth: 12 cm Test dose: negative  Assessment Events: blood not aspirated, injection not painful, no injection resistance, no paresthesia and negative IV test  Additional Notes Patient identified. Risks/Benefits/Options discussed with patient including but not limited to bleeding, infection, nerve damage, paralysis, failed block, incomplete pain control, headache, blood pressure changes, nausea, vomiting, reactions to medication both or allergic, itching and postpartum back pain. Confirmed with bedside nurse the patient's most recent platelet count. Confirmed with patient that they are not currently taking any anticoagulation, have any bleeding history or any family history of bleeding disorders. Patient expressed understanding and wished to proceed. All questions were answered. Sterile technique was used throughout the entire procedure. Please see nursing notes for vital signs. Test dose was given through epidural needle and negative prior to continuing to dose epidural or start infusion. Warning signs of high block given to the patient including shortness of breath, tingling/numbness in hands, complete motor block, or any  concerning symptoms with instructions to call for help. Patient was given instructions on fall risk and not to get out of bed. All questions and concerns addressed with instructions to call with any issues.  1 Attempt (S) . Patient tolerated procedure well.

## 2020-10-14 NOTE — Anesthesia Preprocedure Evaluation (Signed)
Anesthesia Evaluation  Patient identified by MRN, date of birth, ID band Patient awake    Reviewed: Allergy & Precautions, NPO status , Patient's Chart, lab work & pertinent test results  Airway Mallampati: II  TM Distance: >3 FB Neck ROM: Full    Dental no notable dental hx. (+) Teeth Intact, Dental Advisory Given   Pulmonary neg pulmonary ROS,    Pulmonary exam normal breath sounds clear to auscultation       Cardiovascular Exercise Tolerance: Good negative cardio ROS Normal cardiovascular exam Rhythm:Regular Rate:Normal     Neuro/Psych negative neurological ROS     GI/Hepatic negative GI ROS, Neg liver ROS,   Endo/Other  negative endocrine ROS  Renal/GU negative Renal ROS     Musculoskeletal   Abdominal   Peds  Hematology Lab Results      Component                Value               Date                      WBC                      13.0 (H)            10/14/2020                HGB                      13.0                10/14/2020                HCT                      39.9                10/14/2020                MCV                      92.6                10/14/2020                PLT                      223                 10/14/2020              Anesthesia Other Findings   Reproductive/Obstetrics (+) Pregnancy                             Anesthesia Physical Anesthesia Plan  ASA: 2  Anesthesia Plan: Epidural   Post-op Pain Management:    Induction:   PONV Risk Score and Plan:   Airway Management Planned:   Additional Equipment:   Intra-op Plan:   Post-operative Plan:   Informed Consent: I have reviewed the patients History and Physical, chart, labs and discussed the procedure including the risks, benefits and alternatives for the proposed anesthesia with the patient or authorized representative who has indicated his/her understanding and acceptance.        Plan Discussed with:   Anesthesia Plan Comments: (36.6  wk G2P1 for LEA)        Anesthesia Quick Evaluation

## 2020-10-14 NOTE — Progress Notes (Signed)
Operative Delivery Note At 1:39 PM a viable female was delivered via Vaginal, Spontaneous.  Presentation: vertex; Position: Left,, Occiput,, Anterior; Station: +4.  Delivery of the head: spontaneous  ,   Second maneuver: McRobert's position ,   Third maneuver: suprapubic pressure,   Fourth maneuver: counter clockwise corkscrew,   Fifth maneuver: ,   Sixth maneuver: ,    Duration of delivery maneuvers < 1 minute  Verbal consent: unable to obtain verbal consent due to Vertex already delivered .  APGAR: 8, 9; weight  .   Placenta status: intact, to path.   Cord: 3 vessels with the following complications: none.  Cord pH: pending  Anesthesia:   Episiotomy:   Lacerations:  first degress anterior midline, not bleeding, not repaired Suture Repair:  Est. Blood Loss (mL):    Mom to postpartum.  Baby to Couplet care / Skin to Skin.  Roselle Locus II 10/14/2020, 1:55 PM

## 2020-10-14 NOTE — MAU Note (Signed)
April Osborn is a 30 y.o. at [redacted]w[redacted]d here in MAU reporting: contractions since 0500, they are every 5 min. No bleeding or LOF. +FM.  Onset of complaint: today  Pain score: 7/10  Vitals:   10/14/20 0728  BP: 139/86  Pulse: 98  Resp: 16  Temp: 98 F (36.7 C)  SpO2: 99%     FHT:143  Lab orders placed from triage: none

## 2020-10-15 LAB — CBC
HCT: 34.5 % — ABNORMAL LOW (ref 36.0–46.0)
Hemoglobin: 11.4 g/dL — ABNORMAL LOW (ref 12.0–15.0)
MCH: 30.3 pg (ref 26.0–34.0)
MCHC: 33 g/dL (ref 30.0–36.0)
MCV: 91.8 fL (ref 80.0–100.0)
Platelets: 187 10*3/uL (ref 150–400)
RBC: 3.76 MIL/uL — ABNORMAL LOW (ref 3.87–5.11)
RDW: 13.1 % (ref 11.5–15.5)
WBC: 14.8 10*3/uL — ABNORMAL HIGH (ref 4.0–10.5)
nRBC: 0 % (ref 0.0–0.2)

## 2020-10-15 MED ORDER — IBUPROFEN 600 MG PO TABS
600.0000 mg | ORAL_TABLET | Freq: Four times a day (QID) | ORAL | 0 refills | Status: DC | PRN
Start: 2020-10-15 — End: 2021-05-18

## 2020-10-15 NOTE — Progress Notes (Signed)
Infant being kept overnight per pediatrician. Patient's discharge written early; however, she prefers to stay a patient until tomorrow with the baby. Called Dr. Henderson Cloud, who ordered to cancel early discharge. Earl Gala, [place]

## 2020-10-15 NOTE — Lactation Note (Signed)
This note was copied from a baby's chart. Lactation Consultation Note  Patient Name: April Osborn [id number] Date: 10/15/2020 Reason for consult: Follow-up assessment;Late-preterm 34-36.6wks Age:28 hours, LPTI female. Per mom, infant is now latching well at breast, most feedings today has been 20 minutes in length, LC did not observe latch. Per mom, infant breastfeed for 20 minutes and was supplemented with 5 mls of donor breast milk at 1830 pm. Infant is currently asleep in bassinet. Per mom, she aware of the benefits of using the DEBP and breast stimulation, but  her choice is not to pump, only latch infant at the breast.  Mom's plan: 1- Mom will continue to follow LPTI feeding policy, BF infant according to cues, 8 to 12+ times within 24 hours, STS. 2- Not making infant wait to BF and limit total feedings to 30 minutes. 3- After latching infant at breast, mom will increase donor breast milk  intake on day 2 to ( 10 -20 mls) instead of 5 mls per feeding. 4- Mom knows to call RN or LC if she has any BF questions, concerns or needs assistance with latching infant at the breast.   Maternal Data    Feeding Mother's Current Feeding Choice: Breast Milk and Donor Milk Nipple Type: Extra Slow Flow  LATCH Score Latch:  (enc mom to call for latch score)                  Lactation Tools Discussed/Used    Interventions Interventions: Skin to skin;DEBP;Education;Breast compression  Discharge    Consult Status Consult Status: Follow-up Date: 10/16/20 Follow-up type: In-patient    Danelle Earthly 10/15/2020, 8:09 PM

## 2020-10-15 NOTE — Discharge Summary (Signed)
Postpartum Discharge Summary  Date of Service updated7/9/22     Patient Name: April Osborn DOB: 14-Nov-1990 MRN: [id number]  Date of admission: 10/14/2020 Delivery date:10/14/2020  Delivering provider: Everlene Farrier  Date of discharge: 10/15/2020  Admitting diagnosis: Preterm labor [O60.00] Intrauterine pregnancy: [redacted]w[redacted]d     Secondary diagnosis:  Active Problems:   Preterm labor  Additional problems:     Discharge diagnosis: Preterm Pregnancy Delivered                                              Post partum procedures: Augmentation: Pitocin Complications: None  Hospital course: Onset of Labor With Vaginal Delivery      30 y.o. yo [id number] at [redacted]w[redacted]d was admitted in Active Labor on 10/14/2020. Patient had an uncomplicated labor course as follows:  Membrane Rupture Time/Date: 9:50 AM ,10/14/2020   Delivery Method:Vaginal, Spontaneous  Episiotomy: None  Lacerations:  None  Patient had an uncomplicated postpartum course.  She is ambulating, tolerating a regular diet, passing flatus, and urinating well. Patient is discharged home in stable condition on 10/15/20.  Newborn Data: Birth date:10/14/2020  Birth time:1:39 PM  Gender:Female  Living status:Living  Apgars:8 ,9  Weight:2930 g   Magnesium Sulfate received: No BMZ received: No Rhophylac:No MMR:No T-DaP:Given prenatally Flu: No Transfusion:No  Physical exam  Vitals:   10/14/20 1640 10/14/20 2030 10/15/20 0020 10/15/20 0430  BP: 132/70 107/60 (!) 116/59 130/79  Pulse: (!) 109 80 100 (!) 105  Resp: $Remo'16 17 16 17  'zICSf$ Temp: 98.4 F (36.9 C) 98.2 F (36.8 C) 98.9 F (37.2 C) 98 F (36.7 C)  TempSrc: Oral Oral Oral Oral  SpO2: 99% 98% 97% 98%  Weight:      Height:       General: alert, cooperative, and no distress Lochia: appropriate Uterine Fundus: firm Incision: Healing well with no significant drainage DVT Evaluation: No evidence of DVT seen on physical exam. Labs: Lab Results  Component Value Date   WBC 14.8  (H) 10/15/2020   HGB 11.4 (L) 10/15/2020   HCT 34.5 (L) 10/15/2020   MCV 91.8 10/15/2020   PLT 187 10/15/2020   CMP Latest Ref Rng & Units 03/20/2020  Glucose 70 - 99 mg/dL 90  BUN 6 - 20 mg/dL 7  Creatinine 0.44 - 1.00 mg/dL 0.70  Sodium 135 - 145 mmol/L 136  Potassium 3.5 - 5.1 mmol/L 3.7  Chloride 98 - 111 mmol/L 103  CO2 22 - 32 mmol/L 24  Calcium 8.9 - 10.3 mg/dL 8.9  Total Protein 6.5 - 8.1 g/dL 6.7  Total Bilirubin 0.3 - 1.2 mg/dL 0.2(L)  Alkaline Phos 38 - 126 U/L 40  AST 15 - 41 U/L 16  ALT 0 - 44 U/L 16   Edinburgh Score: No flowsheet data found.    After visit meds:  Allergies as of 10/15/2020   No Known Allergies      Medication List     STOP taking these medications    cyclobenzaprine 10 MG tablet Commonly known as: FLEXERIL       TAKE these medications    acetaminophen 325 MG tablet Commonly known as: Tylenol Take 2 tablets (650 mg total) by mouth every 6 (six) hours as needed.   ibuprofen 600 MG tablet Commonly known as: ADVIL Take 1 tablet (600 mg total) by mouth every 6 (six) hours as  needed.   prenatal vitamin w/FE, FA 27-1 MG Tabs tablet Take 1 tablet by mouth daily at 12 noon.         Discharge home in stable condition Infant Feeding: Breast Infant Disposition:home with mother Discharge instruction: per After Visit Summary and Postpartum booklet. Activity: Advance as tolerated. Pelvic rest for 6 weeks.  Diet: routine diet Anticipated Birth Control: Unsure Postpartum Appointment:6 weeks Additional Postpartum F/U:  Future Appointments:No future appointments. Follow up Visit:      10/15/2020 Allena Katz, MD

## 2020-10-15 NOTE — Anesthesia Postprocedure Evaluation (Signed)
Anesthesia Post Note  Patient: April Osborn  Procedure(s) Performed: AN AD HOC LABOR EPIDURAL     Patient location during evaluation: Mother Baby Anesthesia Type: Epidural Level of consciousness: awake Pain management: satisfactory to patient Vital Signs Assessment: post-procedure vital signs reviewed and stable Respiratory status: spontaneous breathing Cardiovascular status: stable Anesthetic complications: no   No notable events documented.  Last Vitals:  Vitals:   10/15/20 0020 10/15/20 0430  BP: (!) 116/59 130/79  Pulse: 100 (!) 105  Resp: 16 17  Temp: 37.2 C 36.7 C  SpO2: 97% 98%    Last Pain:  Vitals:   10/15/20 0843  TempSrc:   PainSc: 0-No pain   Pain Goal:                Epidural/Spinal Function Cutaneous sensation: Normal sensation (10/15/20 0843), Patient able to flex knees: Yes (10/15/20 0843), Patient able to lift hips off bed: Yes (10/15/20 0843), Back pain beyond tenderness at insertion site: No (10/15/20 0843), Progressively worsening motor and/or sensory loss: No (10/15/20 0843), Bowel and/or bladder incontinence post epidural: No (10/15/20 9702)  Cephus Shelling

## 2020-10-16 NOTE — Progress Notes (Signed)
Post Partum Day 2 Subjective: no complaints, up ad lib, voiding, tolerating PO, and + flatus Pediatrics wanted to observe baby for another day and patient's discharge was canceled yesterday. She is ready to go home today. Objective: Blood pressure (!) 110/59, pulse 76, temperature 98.4 F (36.9 C), temperature source Oral, resp. rate 16, height 5\' 8"  (1.727 m), weight 99.8 kg, last menstrual period 01/30/2020, SpO2 100 %, unknown if currently breastfeeding.  Physical Exam:  General: alert, cooperative, and no distress Lochia: appropriate Uterine Fundus: firm Incision: healing well DVT Evaluation: No evidence of DVT seen on physical exam.  Recent Labs    10/14/20 0825 10/15/20 0504  HGB 13.0 11.4*  HCT 39.9 34.5*    Assessment/Plan: Discharge home   LOS: 2 days   12/16/20 II 10/16/2020, 7:52 AM

## 2020-10-16 NOTE — Lactation Note (Addendum)
This note was copied from a baby's chart. Lactation Consultation Note  Patient Name: April Osborn [id number] Date: 10/16/2020 Reason for consult: Follow-up assessment;Late-preterm 34-36.6wks;Infant weight loss;Hyperbilirubinemia Age:28 hours  On arrival, Mom states infant fed 20 ml of DBM at 10 am. LC not able to assess a latch as a result. Mom not been pumping while in the hospital and declined [facility] assessment of her flange size. Mom supplementing with DBM after each feeding 10-20 ml. Mom plans to supplement with formula and EBM once at home. LC encouraged Mom to not use the pacifier seen in infant's mouth until after 4 weeks to establish a good latch.   Mom stated infant nursing at the breast for 30 minutes per feeding and denied any pain with the latch. LC asked to examine Mom's breast prior to d/c but she declined. Comfort gels provided and reviewed to use for nipple pain rinsing in between use and discarding after 6 days.   Infant had 4 urine and 2 stool today  Plan 1. To feed based on cues 8-12x in 24 hr period no more than 3 hrs without an attempt. Infant 8% weight loss so LC reviewed how to reduce calorie loss for LPTI keeping infant STS, hat on all times, supplementing after each feeding at the breasts and total feeding under 30 min       2 Mom states  she will continue supplementation with EBM first followed by formula once she gets home using extra slow flow nipples and paced bottle feeding ( additional nipples provided at time of the visit)   3. Mom Spectra pump at home. Mom states she will start pumping in "comfort of her own home" q 3 hrs for 15 min 4. Signs, symptoms, treatment and prevention of engorgement reviewed. LC also went over signs, symptoms of mastitis and to contact the doctor immediately if these symptoms present themselves.  5. LC brochure of inpatient and outpatient services reviewed.   LC encouraged Mom to call for observation of feeding prior to d/c.  Infant just  had a bilirubin draw and results are pending.  LC talked with provider, NP Barnetta Chapel) on the summarized visit with mother as stated above.   Maternal Data    Feeding Mother's Current Feeding Choice: Breast Milk and Donor Milk Nipple Type: Extra Slow Flow  LATCH Score                    Lactation Tools Discussed/Used Tools: Pump Breast pump type: Double-Electric Breast Pump Pump Education:  (Mom not pumping while in the hospital, states use her Spectra pump once she is at home. Mom declined me assessing her flange size prior to d/c) Reason for Pumping: increase stimulation Pumping frequency: every 3 hrs for 15 min  Interventions Interventions: Breast feeding basics reviewed;Education;Skin to skin;DEBP;Comfort gels  Discharge Discharge Education: Engorgement and breast care;Warning signs for feeding baby Pump: Personal  Consult Status Consult Status: Complete Date: 10/16/20 Follow-up type: In-patient    April Eye  Osborn 10/16/2020, 11:42 AM

## 2020-10-18 NOTE — Anesthesia Postprocedure Evaluation (Signed)
Anesthesia Post Note  Patient: April Osborn  Procedure(s) Performed: AN AD HOC LABOR EPIDURAL     Patient location during evaluation: Mother Baby Anesthesia Type: Epidural Level of consciousness: awake and alert Pain management: pain level controlled Vital Signs Assessment: post-procedure vital signs reviewed and stable Respiratory status: spontaneous breathing, nonlabored ventilation and respiratory function stable Cardiovascular status: stable Postop Assessment: no headache, no backache and epidural receding Anesthetic complications: no   No notable events documented.  Last Vitals:  Vitals:   10/15/20 2045 10/16/20 0555  BP: 122/77 (!) 110/59  Pulse: 84 76  Resp: 17 16  Temp: 36.8 C 36.9 C  SpO2: 99% 100%    Last Pain:  Vitals:   10/16/20 1119  TempSrc:   PainSc: 0-No pain                 Trevor Iha

## 2020-10-27 ENCOUNTER — Telehealth (HOSPITAL_COMMUNITY): Payer: Self-pay

## 2020-10-27 NOTE — Telephone Encounter (Signed)
Discharge follow-up call, left message and call back number. 

## 2020-12-16 IMAGING — US US OB COMP LESS 14 WK
1 series · 15 of 23 positions shown · non-contrast
Comparison: None.

CLINICAL DATA: Initial evaluation for acute pelvic cramping,
bleeding. Early pregnancy.

EXAM:
OBSTETRIC <14 WK ULTRASOUND
TECHNIQUE: Transabdominal ultrasound was performed for evaluation of the
gestation as well as the maternal uterus and adnexal regions.

[Series 1: us ob comp less 14 wk · 23 acquisitions, 15 frames shown]
[im 1/23]
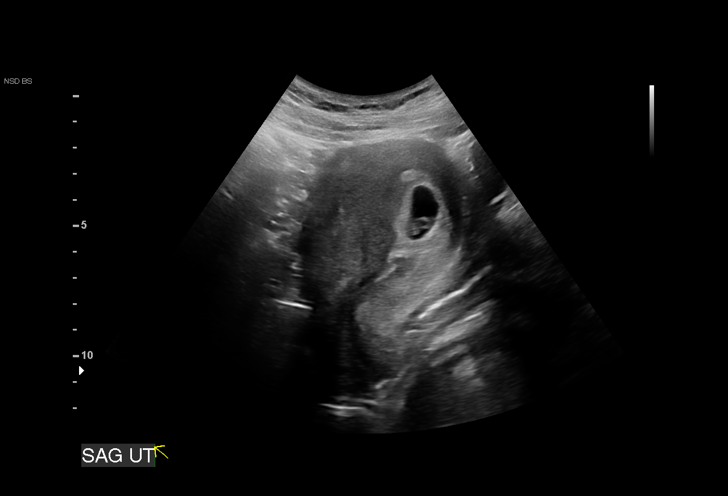
[im 3/23]
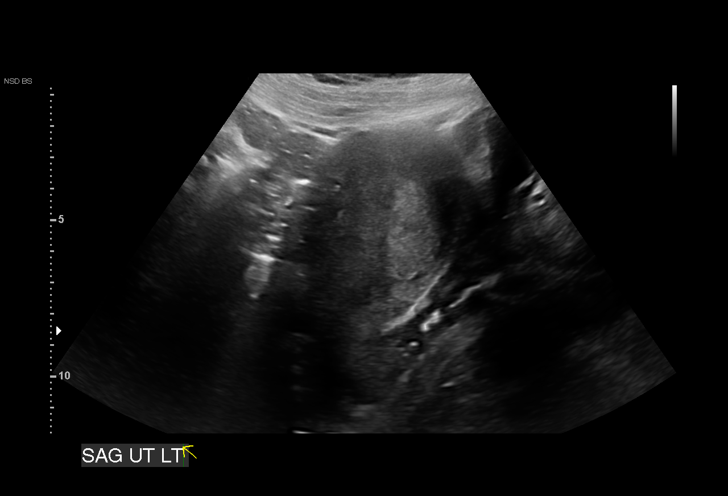
[im 4/23]
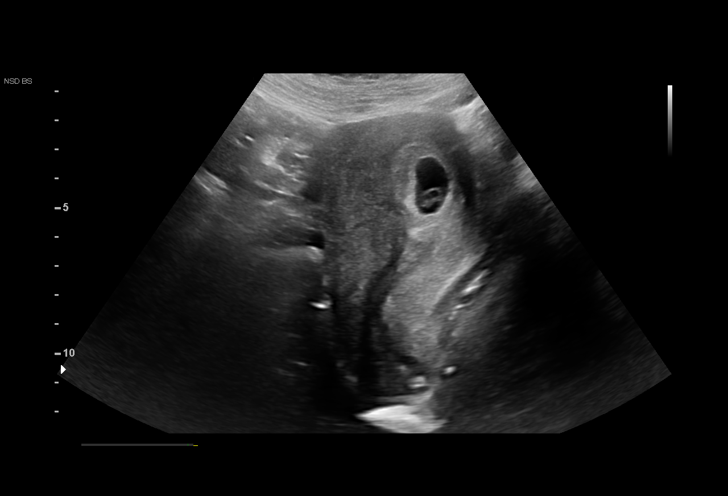
[im 6/23]
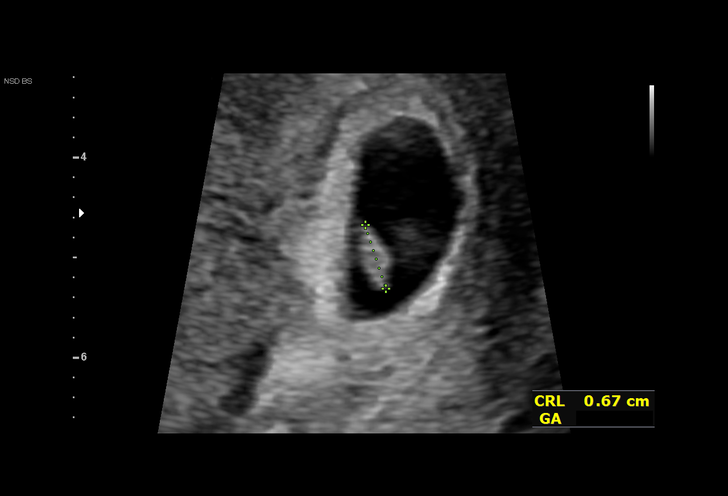
[im 7/23]
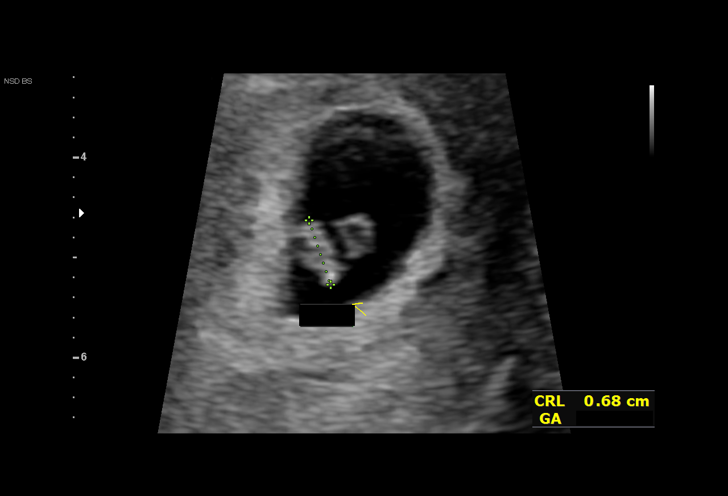
[im 9/23]
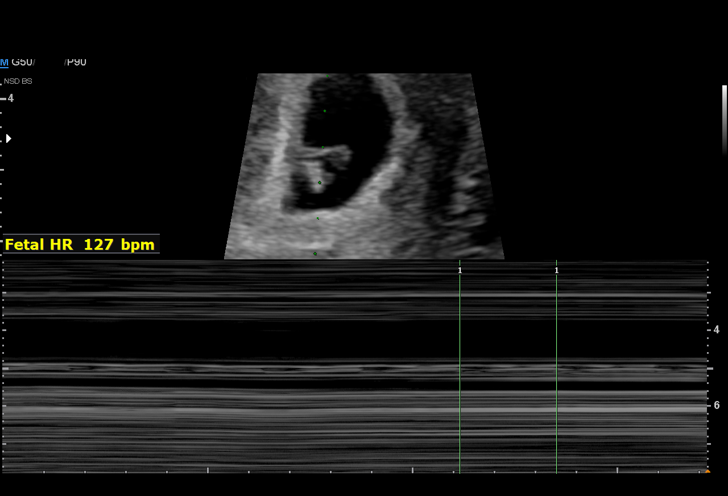
[im 10/23]
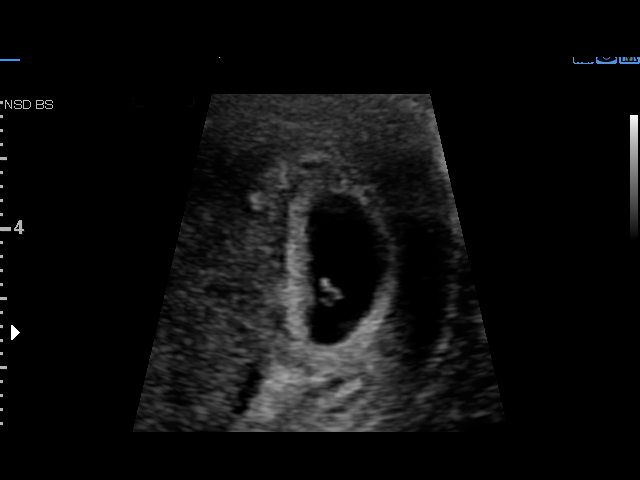
[im 12/23]
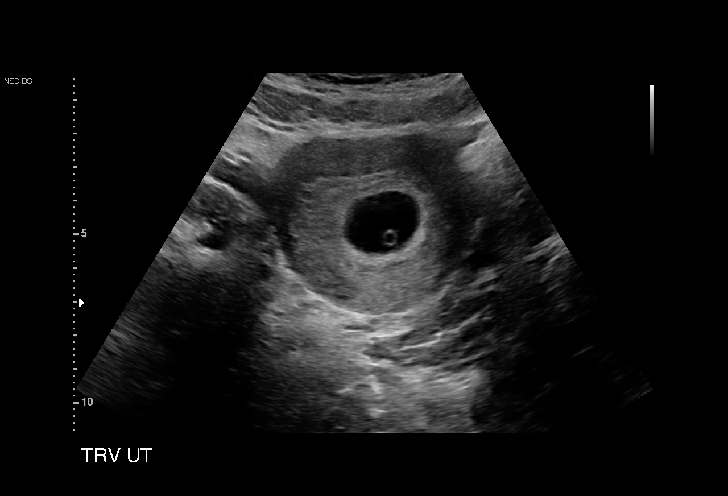
[im 14/23]
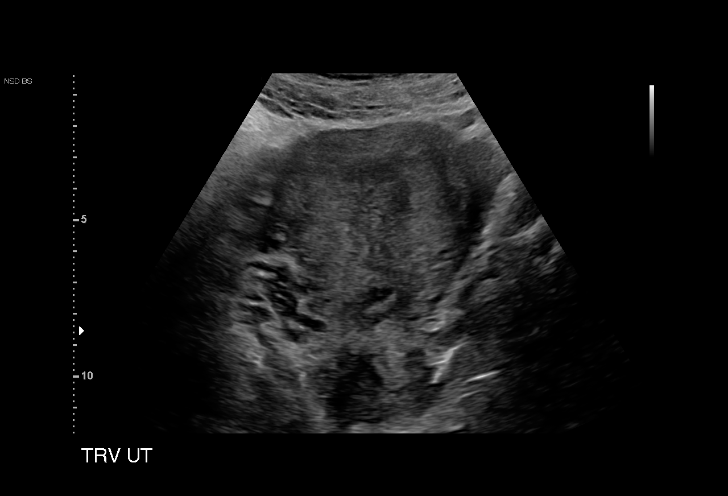
[im 15/23]
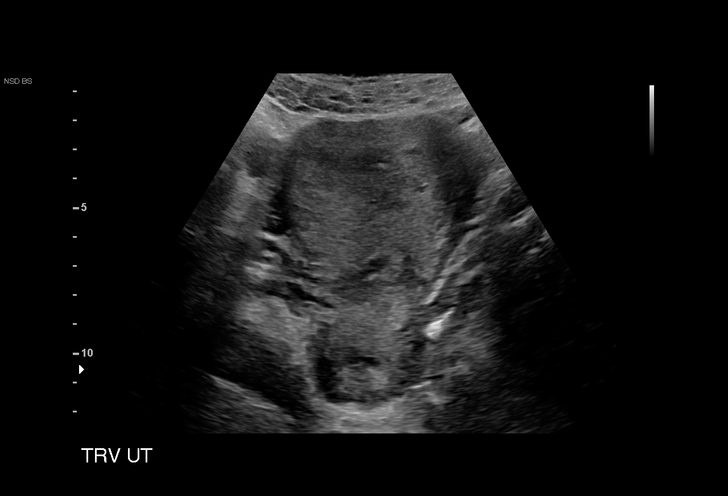
[im 17/23]
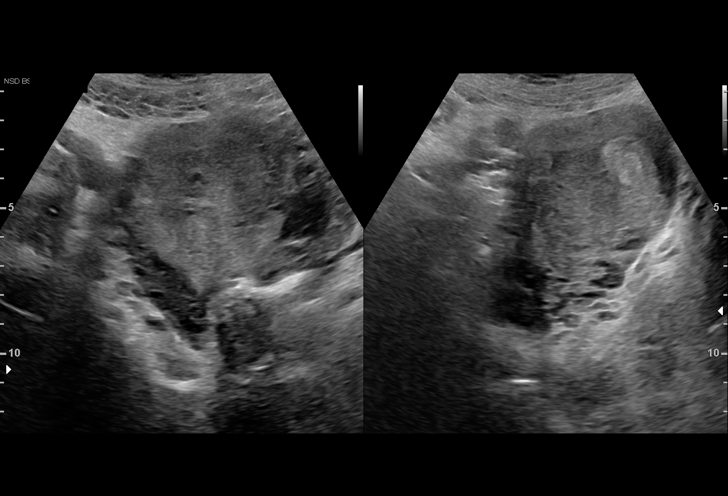
[im 18/23]
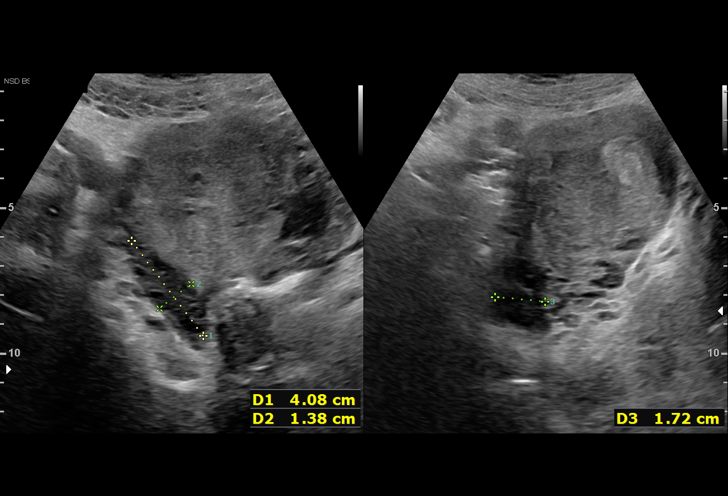
[im 20/23]
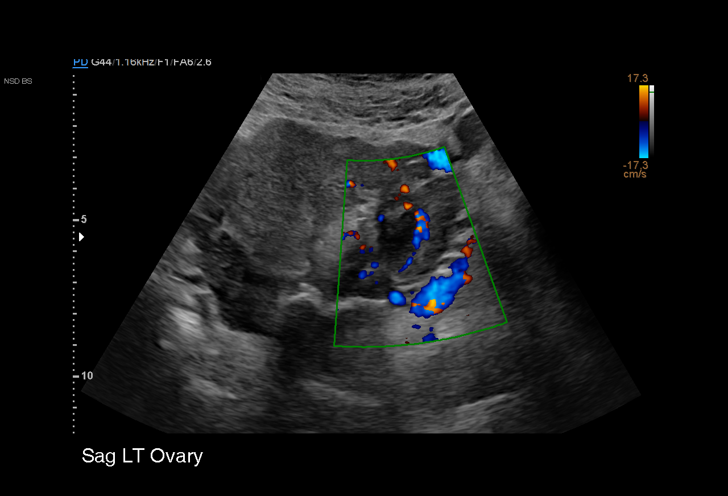
[im 21/23]
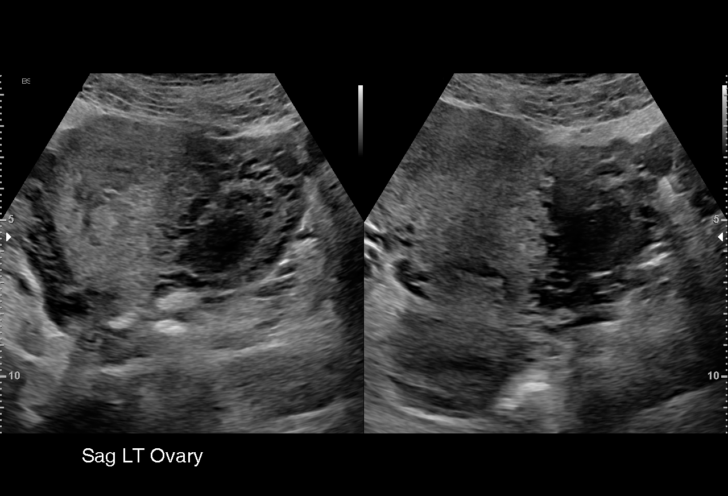
[im 23/23]
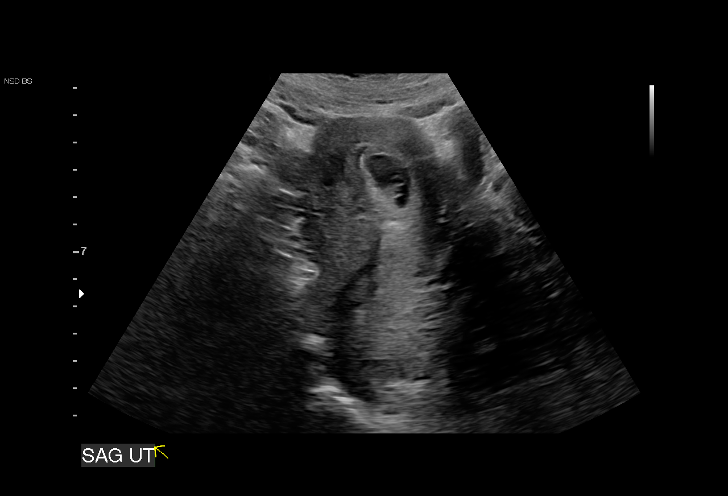

[15 of 23 positions shown; findings below may reference images not displayed]

FINDINGS: Intrauterine gestational sac: Single

Yolk sac:  Present

Embryo:  Present

Cardiac Activity: Present

Heart Rate: 127 bpm

CRL: 6.6 mm   6 w 3 d                  US EDC: 11/10/2020

Subchorionic hemorrhage:  None visualized.

Maternal uterus/adnexae: Ovaries within normal limits bilaterally.
Small corpus luteal cyst noted on the left. No adnexal mass or free
fluid.
IMPRESSION: 1. Single viable intrauterine pregnancy as above, estimated
gestational age 6 weeks and 3 days by crown-rump length, with
ultrasound EDC of 11/10/2020. No complication.
2. No other acute maternal uterine or adnexal abnormality.

## 2021-02-07 ENCOUNTER — Telehealth: Payer: 59 | Admitting: Physician Assistant

## 2021-02-07 ENCOUNTER — Emergency Department (HOSPITAL_COMMUNITY): Payer: 59

## 2021-02-07 ENCOUNTER — Other Ambulatory Visit: Payer: Self-pay

## 2021-02-07 ENCOUNTER — Emergency Department (HOSPITAL_COMMUNITY)
Admission: EM | Admit: 2021-02-07 | Discharge: 2021-02-08 | Disposition: A | Discharge status: Home / Self Care | Payer: 59 | Attending: Emergency Medicine | Admitting: Emergency Medicine

## 2021-02-07 DIAGNOSIS — R309 Painful micturition, unspecified: Secondary | ICD-10-CM | POA: Insufficient documentation

## 2021-02-07 DIAGNOSIS — R1011 Right upper quadrant pain: Secondary | ICD-10-CM | POA: Insufficient documentation

## 2021-02-07 DIAGNOSIS — Z5321 Procedure and treatment not carried out due to patient leaving prior to being seen by health care provider: Secondary | ICD-10-CM | POA: Diagnosis not present

## 2021-02-07 DIAGNOSIS — R3989 Other symptoms and signs involving the genitourinary system: Secondary | ICD-10-CM | POA: Diagnosis not present

## 2021-02-07 LAB — COMPREHENSIVE METABOLIC PANEL
ALT: 22 U/L (ref 0–44)
AST: 21 U/L (ref 15–41)
Albumin: 3.8 g/dL (ref 3.5–5.0)
Alkaline Phosphatase: 57 U/L (ref 38–126)
Anion gap: 6 (ref 5–15)
BUN: 14 mg/dL (ref 6–20)
CO2: 26 mmol/L (ref 22–32)
Calcium: 9.3 mg/dL (ref 8.9–10.3)
Chloride: 104 mmol/L (ref 98–111)
Creatinine, Ser: 0.86 mg/dL (ref 0.44–1.00)
GFR, Estimated: >60 mL/min (ref >60)
Glucose, Bld: 89 mg/dL (ref 70–99)
Potassium: 4.1 mmol/L (ref 3.5–5.1)
Sodium: 136 mmol/L (ref 135–145)
Total Bilirubin: 0.4 mg/dL (ref 0.3–1.2)
Total Protein: 7 g/dL (ref 6.5–8.1)

## 2021-02-07 LAB — URINALYSIS, ROUTINE W REFLEX MICROSCOPIC
Bilirubin Urine: NEGATIVE
Glucose, UA: NEGATIVE mg/dL
Ketones, ur: NEGATIVE mg/dL
Nitrite: POSITIVE — AB
Protein, ur: 30 mg/dL — AB
Specific Gravity, Urine: 1.017 (ref 1.005–1.030)
WBC, UA: >50 WBC/hpf — ABNORMAL HIGH (ref 0–5)
pH: 5 (ref 5.0–8.0)

## 2021-02-07 LAB — CBC WITH DIFFERENTIAL/PLATELET
Abs Immature Granulocytes: 0.02 10*3/uL (ref 0.00–0.07)
Basophils Absolute: 0 10*3/uL (ref 0.0–0.1)
Basophils Relative: 0 %
Eosinophils Absolute: 0.2 10*3/uL (ref 0.0–0.5)
Eosinophils Relative: 2 %
HCT: 41.2 % (ref 36.0–46.0)
Hemoglobin: 13.2 g/dL (ref 12.0–15.0)
Immature Granulocytes: 0 %
Lymphocytes Relative: 18 %
Lymphs Abs: 1.7 10*3/uL (ref 0.7–4.0)
MCH: 30.1 pg (ref 26.0–34.0)
MCHC: 32 g/dL (ref 30.0–36.0)
MCV: 93.8 fL (ref 80.0–100.0)
Monocytes Absolute: 0.8 10*3/uL (ref 0.1–1.0)
Monocytes Relative: 8 %
Neutro Abs: 6.8 10*3/uL (ref 1.7–7.7)
Neutrophils Relative %: 72 %
Platelets: 276 10*3/uL (ref 150–400)
RBC: 4.39 MIL/uL (ref 3.87–5.11)
RDW: 13 % (ref 11.5–15.5)
WBC: 9.5 10*3/uL (ref 4.0–10.5)
nRBC: 0 % (ref 0.0–0.2)

## 2021-02-07 LAB — I-STAT BETA HCG BLOOD, ED (MC, WL, AP ONLY): I-stat hCG, quantitative: <5 m[IU]/mL (ref <5)

## 2021-02-07 LAB — LIPASE, BLOOD: Lipase: 23 U/L (ref 11–51)

## 2021-02-07 NOTE — ED Notes (Signed)
Pt called for triage, no response. Moving pt OTF.

## 2021-02-07 NOTE — ED Provider Notes (Signed)
Emergency Medicine Provider Triage Evaluation Note  April Osborn , a 30 y.o. female  was evaluated in triage.  Pt complains of right back pain since this morning. She has associated dysuria x 1 week, blood on tissue with wiping, abdominal cramping, right upper abdominal pain, mild nausea, resolved vomiting. Hasn't tried medications. Denies fever, chills, or vaginal discharge. Patient unsure of last menstrual period.   Review of Systems  Positive: Right flank pain, dysuria Negative: Fever, chills  Physical Exam  BP (!) 142/99 (BP Location: Right Arm)   Pulse 73   Temp 98.3 F (36.8 C)   Resp 16   SpO2 100%  Gen:   Awake, no distress   Resp:  Normal effort  MSK:   Moves extremities without difficulty  Other:  + Right CVA tenderness. + Murphys sign.  Medical Decision Making  Medically screening exam initiated at 4:42 PM.  Appropriate orders placed.  April Osborn was informed that the remainder of the evaluation will be completed by another provider, this initial triage assessment does not replace that evaluation, and the importance of remaining in the ED until their evaluation is complete.  Offered Zofran to patient for nausea, patient stated that she doesn't like to take medications and declined Zofran. Informed patient to inform staff if she would like Zofran, patient acknowledged.     [address] Wonda Goodgame A, [place] 02/07/21 1658    Milagros Loll, MD 02/10/21 [phone]

## 2021-02-07 NOTE — ED Triage Notes (Signed)
Pt with burning with urination x 1 week and R flank pain since this morning.

## 2021-02-08 MED ORDER — CEPHALEXIN 500 MG PO CAPS: 500.0000 mg | ORAL_CAPSULE | Freq: Two times a day (BID) | ORAL | 0 refills | Status: DC

## 2021-02-08 NOTE — Progress Notes (Signed)

## 2021-02-08 NOTE — ED Notes (Signed)
Patient states she is leaving d/t wait time 

## 2021-02-15 ENCOUNTER — Telehealth: Payer: 59 | Admitting: Physician Assistant

## 2021-02-15 DIAGNOSIS — R6889 Other general symptoms and signs: Secondary | ICD-10-CM | POA: Diagnosis not present

## 2021-02-16 MED ORDER — LIDOCAINE VISCOUS HCL 2 % MT SOLN: OROMUCOSAL | 0 refills | Status: DC

## 2021-02-16 MED ORDER — PSEUDOEPH-BROMPHEN-DM 30-2-10 MG/5ML PO SYRP: 5.0000 mL | ORAL_SOLUTION | Freq: Four times a day (QID) | ORAL | 0 refills | Status: DC | PRN

## 2021-02-16 NOTE — Progress Notes (Signed)
E visit for Flu like symptoms   We are sorry that you are not feeling well.  Here is how we plan to help! Based on what you have shared with me it looks like you may have flu-like symptoms that should be watched but do not seem to indicate anti-viral treatment.  Influenza or "the flu" is   an infection caused by a respiratory virus. The flu virus is highly contagious and persons who did not receive their yearly flu vaccination may "catch" the flu from close contact.  We have anti-viral medications to treat the viruses that cause this infection. They are not a "cure" and only shorten the course of the infection. These prescriptions are most effective when they are given within the first 2 days of "flu" symptoms. Antiviral medication are indicated if you have a high risk of complications from the flu. You should  also consider an antiviral medication if you are in close contact with someone who is at risk. These medications can help patients avoid complications from the flu  but have side effects that you should know. Possible side effects from Tamiflu or oseltamivir include nausea, vomiting, diarrhea, dizziness, headaches, eye redness, sleep problems or other respiratory symptoms. You should not take Tamiflu if you have an allergy to oseltamivir or any to the ingredients in Tamiflu.  Based upon your symptoms and potential risk factors I recommend that you follow the flu symptoms recommendation that I have listed below.  I will send in Bromfed DM cough syrup and viscous lidocaine for the cough and sore throat.  You may take ibuprofen and tylenol and alternate every 3-4 hours for body aches or fevers.  Also may take any OTC medications of choice for symptomatic management.  ANYONE WHO HAS FLU SYMPTOMS SHOULD: Stay home. The flu is highly contagious and going out or to work exposes others! Be sure to drink plenty of fluids. Water is fine as well as fruit juices, sodas and electrolyte beverages. You  may want to stay away from caffeine or alcohol. If you are nauseated, try taking small sips of liquids. How do you know if you are getting enough fluid? Your urine should be a pale yellow or almost colorless. Get rest. Taking a steamy shower or using a humidifier may help nasal congestion and ease sore throat pain. Using a saline nasal spray works much the same way. Cough drops, hard candies and sore throat lozenges may ease your cough. Line up a caregiver. Have someone check on you regularly.   GET HELP RIGHT AWAY IF: You cannot keep down liquids or your medications. You become short of breath Your fell like you are going to pass out or loose consciousness. Your symptoms persist after you have completed your treatment plan MAKE SURE YOU  Understand these instructions. Will watch your condition. Will get help right away if you are not doing well or get worse.  Your e-visit answers were reviewed by a board certified advanced clinical practitioner to complete your personal care plan.  Depending on the condition, your plan could have included both over the counter or prescription medications.  If there is a problem please reply  once you have received a response from your provider.  Your safety is important to [place].  If you have drug allergies check your prescription carefully.    You can use MyChart to ask questions about today's visit, request a non-urgent call back, or ask for a work or school excuse for 24 hours related to  this e-Visit. If it has been greater than 24 hours you will need to follow up with your provider, or enter a new e-Visit to address those concerns.  You will get an e-mail in the next two days asking about your experience.  I hope that your e-visit has been valuable and will speed your recovery. Thank you for using e-visits.  I provided 5 minutes of non face-to-face time during this encounter for chart review and documentation.

## 2021-05-18 ENCOUNTER — Other Ambulatory Visit: Payer: Self-pay

## 2021-05-18 ENCOUNTER — Emergency Department (HOSPITAL_COMMUNITY): Payer: Medicaid Other

## 2021-05-18 ENCOUNTER — Emergency Department (HOSPITAL_COMMUNITY)
Admission: EM | Admit: 2021-05-18 | Discharge: 2021-05-18 | Discharge status: Against Medical Advice | Payer: Medicaid Other | Attending: Student | Admitting: Student

## 2021-05-18 ENCOUNTER — Ambulatory Visit (HOSPITAL_COMMUNITY)
Admission: EM | Admit: 2021-05-18 | Discharge: 2021-05-18 | Disposition: A | Discharge status: Home / Self Care | Payer: Medicaid Other | Attending: Urgent Care | Admitting: Urgent Care

## 2021-05-18 ENCOUNTER — Encounter (HOSPITAL_COMMUNITY): Payer: Self-pay

## 2021-05-18 ENCOUNTER — Encounter (HOSPITAL_COMMUNITY): Payer: Self-pay | Admitting: *Deleted

## 2021-05-18 ENCOUNTER — Telehealth: Payer: 59 | Admitting: Physician Assistant

## 2021-05-18 DIAGNOSIS — Z5321 Procedure and treatment not carried out due to patient leaving prior to being seen by health care provider: Secondary | ICD-10-CM | POA: Diagnosis not present

## 2021-05-18 DIAGNOSIS — B3731 Acute candidiasis of vulva and vagina: Secondary | ICD-10-CM

## 2021-05-18 DIAGNOSIS — R110 Nausea: Secondary | ICD-10-CM | POA: Insufficient documentation

## 2021-05-18 DIAGNOSIS — R0781 Pleurodynia: Secondary | ICD-10-CM | POA: Insufficient documentation

## 2021-05-18 DIAGNOSIS — R059 Cough, unspecified: Secondary | ICD-10-CM | POA: Diagnosis not present

## 2021-05-18 DIAGNOSIS — R1012 Left upper quadrant pain: Secondary | ICD-10-CM

## 2021-05-18 LAB — COMPREHENSIVE METABOLIC PANEL
ALT: 17 U/L (ref 0–44)
AST: 17 U/L (ref 15–41)
Albumin: 3.8 g/dL (ref 3.5–5.0)
Alkaline Phosphatase: 50 U/L (ref 38–126)
Anion gap: 8 (ref 5–15)
BUN: 11 mg/dL (ref 6–20)
CO2: 26 mmol/L (ref 22–32)
Calcium: 9 mg/dL (ref 8.9–10.3)
Chloride: 104 mmol/L (ref 98–111)
Creatinine, Ser: 0.9 mg/dL (ref 0.44–1.00)
GFR, Estimated: >60 mL/min (ref >60)
Glucose, Bld: 99 mg/dL (ref 70–99)
Potassium: 3.8 mmol/L (ref 3.5–5.1)
Sodium: 138 mmol/L (ref 135–145)
Total Bilirubin: 0.3 mg/dL (ref 0.3–1.2)
Total Protein: 7 g/dL (ref 6.5–8.1)

## 2021-05-18 LAB — I-STAT BETA HCG BLOOD, ED (MC, WL, AP ONLY): I-stat hCG, quantitative: <5 m[IU]/mL (ref <5)

## 2021-05-18 LAB — CBC WITH DIFFERENTIAL/PLATELET
Abs Immature Granulocytes: 0.02 10*3/uL (ref 0.00–0.07)
Basophils Absolute: 0 10*3/uL (ref 0.0–0.1)
Basophils Relative: 1 %
Eosinophils Absolute: 0.2 10*3/uL (ref 0.0–0.5)
Eosinophils Relative: 4 %
HCT: 40.4 % (ref 36.0–46.0)
Hemoglobin: 13.2 g/dL (ref 12.0–15.0)
Immature Granulocytes: 0 %
Lymphocytes Relative: 31 %
Lymphs Abs: 1.7 10*3/uL (ref 0.7–4.0)
MCH: 30.4 pg (ref 26.0–34.0)
MCHC: 32.7 g/dL (ref 30.0–36.0)
MCV: 93.1 fL (ref 80.0–100.0)
Monocytes Absolute: 0.5 10*3/uL (ref 0.1–1.0)
Monocytes Relative: 9 %
Neutro Abs: 2.9 10*3/uL (ref 1.7–7.7)
Neutrophils Relative %: 55 %
Platelets: 255 10*3/uL (ref 150–400)
RBC: 4.34 MIL/uL (ref 3.87–5.11)
RDW: 12.1 % (ref 11.5–15.5)
WBC: 5.3 10*3/uL (ref 4.0–10.5)
nRBC: 0 % (ref 0.0–0.2)

## 2021-05-18 LAB — LIPASE, BLOOD: Lipase: 23 U/L (ref 11–51)

## 2021-05-18 MED ORDER — FLUCONAZOLE 150 MG PO TABS: 150.0000 mg | ORAL_TABLET | Freq: Once | ORAL | 0 refills | Status: AC

## 2021-05-18 MED ORDER — NAPROXEN 500 MG PO TABS: 500.0000 mg | ORAL_TABLET | Freq: Two times a day (BID) | ORAL | 0 refills | Status: DC

## 2021-05-18 NOTE — ED Triage Notes (Signed)
Pt reports left side lower rib pain that started yesterday, stabbing pain that increases with movement and deep breaths.

## 2021-05-18 NOTE — ED Notes (Signed)
Called to update VS no answer

## 2021-05-18 NOTE — Progress Notes (Signed)

## 2021-05-18 NOTE — ED Provider Notes (Signed)
[facility]    CSN: [id number] Arrival date & time: 05/18/21  1628      History   Chief Complaint Chief Complaint  Patient presents with   Rib Pain    HPI April Osborn is a 31 y.o. female.   Pleasant 31 year old female presents today with an acute onset of left upper quadrant/rib pain.  She states it started abruptly last evening.  She reports the pain seems to be by her left rib cage or possibly underneath.  She went to the emergency room this morning, was seen in triage, and had a CMP, CBC, lipase, hCG hormone lab test all drawn in the emergency room.  She also had a chest x-ray performed.  All of the labs were negative.  The chest x-ray was unremarkable.  She apparently called her PCP with concerns of some vaginal discharge and itching as well, her PCP called in Diflucan for her.  She states she has not yet picked it up.  She presented to the urgent care this afternoon to further assess her left upper quadrant pain.  She denies any additional abdominal discomfort, no vomiting, no diarrhea, no nausea.  She denies any shortness of breath or cough.  She has not taken any over-the-counter medications for her symptoms.  She denies any alcohol intake.  She denies any known history of gallstones or hypertriglyceridemia.  She denies any rash.  She denies any dysuria or frequency.    Past Medical History:  Diagnosis Date   Medical history non-contributory     Patient Active Problem List   Diagnosis Date Noted   Preterm labor 10/14/2020    Past Surgical History:  Procedure Laterality Date   NO PAST SURGERIES      OB History     Gravida  2   Para  2   Term  1   Preterm  1   AB      Living  2      SAB      IAB      Ectopic      Multiple  0   Live Births  1            Home Medications    Prior to Admission medications   Medication Sig Start Date End Date Taking? Authorizing Provider  naproxen (NAPROSYN) 500 MG tablet Take 1 tablet (500 mg  total) by mouth 2 (two) times daily with a meal for 7 days. 05/18/21 05/25/21 Yes Adna Nofziger L, PA  fluconazole (DIFLUCAN) 150 MG tablet Take 1 tablet (150 mg total) by mouth once for 1 dose. 05/18/21 05/18/21  Margaretann Loveless, PA-C  lidocaine (XYLOCAINE) 2 % solution 10mL swish and swallow every 4-6 hours as needed for throat pain 02/16/21   Margaretann Loveless, PA-C  prenatal vitamin w/FE, FA (PRENATAL 1 + 1) 27-1 MG TABS tablet Take 1 tablet by mouth daily at 12 noon.    [provider]    Family History Family History  Problem Relation Age of Onset   Cancer Maternal Grandmother     Social History Social History   Tobacco Use   Smoking status: Never   Smokeless tobacco: Never  Vaping Use   Vaping Use: Never used  Substance Use Topics   Alcohol use: Never   Drug use: Never     Allergies   Patient has no known allergies.   Review of Systems Review of Systems  Gastrointestinal:  Positive for abdominal pain (LUQ only).  Physical Exam Triage Vital Signs ED Triage Vitals  Enc Vitals Group     BP 05/18/21 1816 121/83     Pulse Rate 05/18/21 1816 (!) 57     Resp 05/18/21 1816 18     Temp 05/18/21 1816 98.2 F (36.8 C)     Temp Source 05/18/21 1816 Oral     SpO2 05/18/21 1816 97 %     Weight --      Height --      Head Circumference --      Peak Flow --      Pain Score 05/18/21 1819 6     Pain Loc --      Pain Edu? --      Excl. in GC? --    No data found.  Updated Vital Signs BP 121/83 (BP Location: Right Arm)    Pulse (!) 57    Temp 98.2 F (36.8 C) (Oral)    Resp 18    LMP 03/24/2021    SpO2 97%   Visual Acuity Right Eye Distance:   Left Eye Distance:   Bilateral Distance:    Right Eye Near:   Left Eye Near:    Bilateral Near:     Physical Exam Vitals and nursing note reviewed.  Constitutional:      General: She is not in acute distress.    Appearance: Normal appearance. She is well-developed and normal weight. She is not  ill-appearing, toxic-appearing or diaphoretic.  HENT:     Head: Normocephalic and atraumatic.     Right Ear: Tympanic membrane, ear canal and external ear normal. There is no impacted cerumen.     Left Ear: Tympanic membrane, ear canal and external ear normal. There is no impacted cerumen.     Nose: Nose normal. No congestion or rhinorrhea.     Mouth/Throat:     Mouth: Mucous membranes are moist.     Pharynx: Oropharynx is clear. No oropharyngeal exudate or posterior oropharyngeal erythema.  Eyes:     Extraocular Movements: Extraocular movements intact.     Conjunctiva/sclera: Conjunctivae normal.     Pupils: Pupils are equal, round, and reactive to light.  Cardiovascular:     Rate and Rhythm: Normal rate and regular rhythm.     Pulses: Normal pulses.     Heart sounds: No murmur heard.   No friction rub. No gallop.  Pulmonary:     Effort: Pulmonary effort is normal. No respiratory distress.     Breath sounds: Normal breath sounds. No stridor. No wheezing, rhonchi or rales.  Chest:     Chest wall: No tenderness.  Abdominal:     General: Abdomen is flat. Bowel sounds are normal. There is no distension.     Palpations: Abdomen is soft. There is no mass.     Tenderness: There is no abdominal tenderness. There is no right CVA tenderness, left CVA tenderness, guarding or rebound.     Hernia: No hernia is present.     Comments: Mild discomfort to direct palpation over inferior, lateral ribs on left.  No splenomegaly No reproducible abdominal pain  Genitourinary:    Comments: deferred Musculoskeletal:        General: Tenderness (L ribs) present. No swelling, deformity or signs of injury. Normal range of motion.     Cervical back: Normal range of motion and neck supple. No rigidity or tenderness.     Right lower leg: No edema.     Left lower leg: No edema.  Lymphadenopathy:     Cervical: No cervical adenopathy.  Skin:    General: Skin is warm and dry.     Capillary Refill: Capillary  refill takes less than 2 seconds.     Findings: No bruising, erythema or rash.  Neurological:     Mental Status: She is alert.  Psychiatric:        Mood and Affect: Mood normal.     UC Treatments / Results  Labs (all labs ordered are listed, but only abnormal results are displayed) Labs Reviewed - No data to display   EKG   Radiology DG Chest 2 View  Result Date: 05/18/2021 CLINICAL DATA:  Pain with deep breaths EXAM: CHEST - 2 VIEW COMPARISON:  None. FINDINGS: No evidence no consolidation. No visible pleural effusions or pneumothorax. Cardiomediastinal silhouette is within normal limits. No evidence of acute osseous abnormality. IMPRESSION: Of acute cardiopulmonary disease. Electronically Signed   By: Feliberto Harts M.D.   On: 05/18/2021 10:30    Procedures Procedures (including critical care time)  Medications Ordered in UC Medications - No data to display  Initial Impression / Assessment and Plan / UC Course  I have reviewed the triage vital signs and the nursing notes.  Pertinent labs & imaging results that were available during my care of the patient were reviewed by me and considered in my medical decision making (see chart for details).     LUQ pain -exam in office benign.  Chest x-ray in ER and labs in ER negative.  UA was ordered, however patient left before giving a sample.  Patient left prior to receiving discharge papers. Rib pain -I suspect this is likely the cause of her symptoms, question inflammatory?  We will treat supportively with a heating pad and anti-inflammatory medication.  Patient is to follow-up with her PCP should symptoms persist, or head back to the emergency room if anything worsens significantly, including fever or intractable nausea vomiting  Final Clinical Impressions(s) / UC Diagnoses   Final diagnoses:  Abdominal pain, left upper quadrant  Rib pain     Discharge Instructions      Your labs and chest xray from the ER are  normal. Unfortunately we were unable to process the urine due to lack of specimen. We will start you on an antiinflammatory to see if your symptoms might be related to inflammation around your ribs. Start taking this twice daily with food. You may also add moist heat with a microwaveable heating pad. Follow up with your PCP should symptoms persist.     ED Prescriptions     Medication Sig Dispense Auth. Provider   naproxen (NAPROSYN) 500 MG tablet Take 1 tablet (500 mg total) by mouth 2 (two) times daily with a meal for 7 days. 14 tablet Lucila Klecka L, [place]      PDMP not reviewed this encounter.   Maretta Bees, [place] 05/18/21 2142

## 2021-05-18 NOTE — ED Triage Notes (Signed)
Pt reports left side lower rib pain that started yesterday, stabbing pain that increases with movement and deep breaths. Denies injury. Reports recent mild cough. No acute distress noted.

## 2021-05-18 NOTE — ED Notes (Signed)
Pt unable to void, given a cup of water

## 2021-05-18 NOTE — ED Provider Triage Note (Signed)
Emergency Medicine Provider Triage Evaluation Note  April Osborn , a 31 y.o. female  was evaluated in triage.  Pt complains of left upper quadrant abdominal pain.  Patient states the pain began last night.  She describes it as constant, sharp, nonradiating.  She has had nausea without vomiting.  She denies any diarrhea..  Denies fevers or previous abdominal surgeries.  She states that the pain is worse with deep breaths.  Has had cough x1 month.  PERC 0.  Review of Systems  Positive: See above Negative:   Physical Exam  BP (!) 124/98 (BP Location: Right Arm)    Pulse 78    Temp 97.9 F (36.6 C) (Oral)    Resp 17    SpO2 97%  Gen:   Awake, no distress   Resp:  Normal effort, lungs clear bilaterally MSK:   Moves extremities without difficulty  Other:  Alden Server to palpation of the left upper quadrant/left lower rib cage, abdomen soft, bowel sounds present  Medical Decision Making  Medically screening exam initiated at 10:13 AM.  Appropriate orders placed.  April Osborn was informed that the remainder of the evaluation will be completed by another provider, this initial triage assessment does not replace that evaluation, and the importance of remaining in the ED until their evaluation is complete.     Cristopher Peru, PA-C 05/18/21 1014

## 2021-05-18 NOTE — Discharge Instructions (Signed)
Your labs and chest xray from the ER are normal. Unfortunately we were unable to process the urine due to lack of specimen. We will start you on an antiinflammatory to see if your symptoms might be related to inflammation around your ribs. Start taking this twice daily with food. You may also add moist heat with a microwaveable heating pad. Follow up with your PCP should symptoms persist.

## 2021-05-22 ENCOUNTER — Telehealth: Payer: Medicaid Other | Admitting: Physician Assistant

## 2021-05-22 DIAGNOSIS — R300 Dysuria: Secondary | ICD-10-CM

## 2021-05-22 DIAGNOSIS — M545 Low back pain, unspecified: Secondary | ICD-10-CM

## 2021-05-22 DIAGNOSIS — N898 Other specified noninflammatory disorders of vagina: Secondary | ICD-10-CM

## 2021-05-22 NOTE — Progress Notes (Signed)
Based on what you shared with me, I feel your condition warrants further evaluation and I recommend that you be seen in a face to face visit with your gynecologist or at one of our [facility] clinics.  Being that you are having dysuria with a thick vaginal discharge and back pain, you should be evaluated in person with a urinalysis and culture and possibly a vaginal swab for the discharge.   NOTE: There will be NO CHARGE for this eVisit   If you are having a true medical emergency please call 911.    *Center for [facility] at [employer]             [address] [place], [place] [address] [phone] (*Take patients with no insurance)  *Center for [employer] at [employer] [address] [place], [place],  [place]  [address] [phone] (*Take patients with no insurance)  Center for [employer] at [employer]                                                             [address], [place], [place], [address] [phone]  Center for [facility] at [facility] [address], [place], [place], [address] [phone]  Center for [facility] at [facility] [address], [place], [place], [address] [phone]  Center for [facility] at [facility]                                 [address] [place], [place], [place], [address] [phone]  Center for [facility] at [facility]                                    [address], [place], [place], [address] [phone]  Center for [facility] at [facility] [address], [place], [place], [address]                              [facility] [address], [place], [place], [address] [phone]  Your MyChart E-visit questionnaire answers were reviewed by a board certified advanced clinical practitioner to complete your personal care plan based  on your specific symptoms.  Thank you for using e-Visits.   I provided 5 minutes of non face-to-face time during this encounter for chart review and documentation.

## 2021-05-23 ENCOUNTER — Telehealth: Payer: Medicaid Other | Admitting: Family

## 2021-05-23 DIAGNOSIS — M545 Low back pain, unspecified: Secondary | ICD-10-CM

## 2021-05-24 MED ORDER — NAPROXEN 500 MG PO TABS: 500.0000 mg | ORAL_TABLET | Freq: Two times a day (BID) | ORAL | 0 refills | Status: DC

## 2021-05-24 MED ORDER — BACLOFEN 10 MG PO TABS: 10.0000 mg | ORAL_TABLET | Freq: Three times a day (TID) | ORAL | 0 refills | Status: DC

## 2021-05-24 NOTE — Progress Notes (Signed)

## 2021-06-04 ENCOUNTER — Telehealth: Payer: Medicaid Other | Admitting: Physician Assistant

## 2021-06-04 DIAGNOSIS — R3989 Other symptoms and signs involving the genitourinary system: Secondary | ICD-10-CM | POA: Diagnosis not present

## 2021-06-05 MED ORDER — CEPHALEXIN 500 MG PO CAPS: 500.0000 mg | ORAL_CAPSULE | Freq: Two times a day (BID) | ORAL | 0 refills | Status: DC

## 2021-06-05 NOTE — Progress Notes (Signed)

## 2021-07-30 ENCOUNTER — Telehealth: Payer: Medicaid Other | Admitting: Family

## 2021-07-30 DIAGNOSIS — H109 Unspecified conjunctivitis: Secondary | ICD-10-CM

## 2021-07-30 MED ORDER — POLYMYXIN B-TRIMETHOPRIM 10000-0.1 UNIT/ML-% OP SOLN: 1.0000 [drp] | Freq: Four times a day (QID) | OPHTHALMIC | 0 refills | Status: DC

## 2021-07-30 NOTE — Progress Notes (Signed)

## 2021-08-07 ENCOUNTER — Telehealth: Payer: Medicaid Other | Admitting: Physician Assistant

## 2021-08-07 DIAGNOSIS — R197 Diarrhea, unspecified: Secondary | ICD-10-CM | POA: Diagnosis not present

## 2021-08-07 MED ORDER — ONDANSETRON HCL 4 MG PO TABS: 4.0000 mg | ORAL_TABLET | Freq: Three times a day (TID) | ORAL | 0 refills | Status: DC | PRN

## 2021-08-07 NOTE — Progress Notes (Signed)
We are sorry that you are not feeling well.  Here is how we plan to help! ? ?Based on what you have shared with me it looks like you have Acute Infectious Diarrhea. ? ?Most cases of acute diarrhea are due to infections with virus and bacteria and are self-limited conditions lasting less than 14 days. ? ?For your symptoms you may take Imodium 2 mg tablets that are over the counter at your local pharmacy. Take two tablet now and then one after each loose stool up to 6 a day.  ?Antibiotics are not needed for most people with diarrhea. ? ?Optional: Zofran 4 mg 1 tablet every 8 hours as needed for nausea and vomiting ? ?HOME CARE ?We recommend changing your diet to help with your symptoms for the next few days. ?Drink plenty of fluids that contain water salt and sugar. Sports drinks such as Gatorade may help.  ?You may try broths, soups, bananas, applesauce, soft breads, mashed potatoes or crackers.  ?You are considered infectious for as long as the diarrhea continues. Hand washing or use of alcohol based hand sanitizers is recommend. ?It is best to stay out of work or school until your symptoms stop.  ? ?GET HELP RIGHT AWAY ?If you have dark yellow colored urine or do not pass urine frequently you should drink more fluids.   ?If your symptoms worsen  ?If you feel like you are going to pass out (faint) ?You have a new problem ? ?MAKE SURE YOU  ?Understand these instructions. ?Will watch your condition. ?Will get help right away if you are not doing well or get worse. ? ?Thank you for choosing an e-visit. ? ?Your e-visit answers were reviewed by a board certified advanced clinical practitioner to complete your personal care plan. Depending upon the condition, your plan could have included both over the counter or prescription medications. ? ?Please review your pharmacy choice. Make sure the pharmacy is open so you can pick up prescription now. If there is a problem, you may contact your provider through [employer]  and have the prescription routed to another pharmacy.  Your safety is important to [place]. If you have drug allergies check your prescription carefully.  ? ?For the next 24 hours you can use MyChart to ask questions about today's visit, request a non-urgent call back, or ask for a work or school excuse. ?You will get an email in the next two days asking about your experience. I hope that your e-visit has been valuable and will speed your recovery. ? ? ?Approximately 5 minutes was spent documenting and reviewing patient's chart. ? ?

## 2021-11-14 ENCOUNTER — Telehealth: Payer: Medicaid Other | Admitting: Physician Assistant

## 2021-11-14 DIAGNOSIS — N921 Excessive and frequent menstruation with irregular cycle: Secondary | ICD-10-CM

## 2021-11-15 NOTE — Progress Notes (Signed)
Because of heavy irregular bleeding,  I feel your condition warrants further evaluation and I recommend that you be seen in a face to face visit with your gynecologist or at one of our [facility] clinics.   NOTE: There will be NO CHARGE for this eVisit   If you are having a true medical emergency please call 911.    *Center for [facility] at [employer]             [address] [place], [place] [address] [phone] (*Take patients with no insurance)  *Center for [employer] at [employer] [address] [place], [place],  [place]  [address] [phone] (*Take patients with no insurance)  Center for [employer] at [employer]                                                             [address], [place], [place], [address] [phone]  Center for [facility] at [facility] [address], [place], [place], [address] [phone]  Center for [facility] at [facility] [address], [place], [place], [address] [phone]  Center for [facility] at [facility]                                 [address] [place], [place], [place], [address] [phone]  Center for [facility] at [facility]                                    [address] [place], [place], [address] [phone]  Center for [facility] at [facility] [address], [place], [place], [address]                              [facility] [address], [place], [place], [address] [phone]  Your MyChart E-visit questionnaire answers were reviewed by a board certified advanced clinical practitioner to complete your personal care plan based on your specific symptoms.  Thank you for using e-Visits.   I provided 5 minutes of non face-to-face time during this encounter for chart review and documentation.

## 2021-12-21 ENCOUNTER — Telehealth: Payer: Medicaid Other | Admitting: Emergency Medicine

## 2021-12-21 DIAGNOSIS — J302 Other seasonal allergic rhinitis: Secondary | ICD-10-CM | POA: Diagnosis not present

## 2021-12-21 MED ORDER — LEVOCETIRIZINE DIHYDROCHLORIDE 5 MG PO TABS: 5.0000 mg | ORAL_TABLET | Freq: Every evening | ORAL | 0 refills | Status: DC

## 2021-12-21 MED ORDER — LORATADINE 10 MG PO TABS: 10.0000 mg | ORAL_TABLET | Freq: Every day | ORAL | 11 refills | Status: DC

## 2021-12-21 MED ORDER — OLOPATADINE HCL 0.1 % OP SOLN: 1.0000 [drp] | Freq: Two times a day (BID) | OPHTHALMIC | 1 refills | Status: DC

## 2021-12-21 MED ORDER — FLUTICASONE PROPIONATE 50 MCG/ACT NA SUSP: 2.0000 | Freq: Every day | NASAL | 0 refills | Status: DC

## 2021-12-21 NOTE — Addendum Note (Signed)
Addended by: Waldon Merl on: 12/21/2021 03:15 PM   Modules accepted: Orders

## 2021-12-21 NOTE — Progress Notes (Signed)
E visit for Allergic Rhinitis We are sorry that you are not feeling well.  Here is how we plan to help!  Based on what you have shared with me it looks like you have Allergic Rhinitis.  Rhinitis is when a reaction occurs that causes nasal congestion, runny nose, sneezing, and itching.  Most types of rhinitis are caused by an inflammation and are associated with symptoms in the eyes ears or throat. There are several types of rhinitis.  The most common are acute rhinitis, which is usually caused by a viral illness, allergic or seasonal rhinitis, and nonallergic or year-round rhinitis.  Nasal allergies occur certain times of the year.  Allergic rhinitis is caused when allergens in the air trigger the release of histamine in the body.  Histamine causes itching, swelling, and fluid to build up in the fragile linings of the nasal passages, sinuses and eyelids.  An itchy nose and clear discharge are common.  I recommend the following treatments (I sent prescriptions of the medications below to your pharmacy - they are all available over the counter but Medicaid covers them if you have Medicaid:  Take Claritin (or generic loratadine) daily.   I also would recommend a nasal spray: Flonase 2 sprays into each nostril once daily and Saline 1 spray into each nostril as needed. You can use saline spray or saline irrigation as often as you need to help manage sneezing and runny nose. If you want to try saline irrigation, try using a neti pot several times a day while you have symptoms. Many neti pots come with salt packets premeasured to use to make saline. If you use your own salt, make sure it is kosher salt or sea salt (don't use table salt as it has iodine in it and you don't need that in your nose). Use distilled water to make saline. If you mix your own saline using your own salt, the recipe is 1/4 teaspoon salt in 1 cup warm water. Using saline irrigation can help manage allergies and can also help prevent and  treat sinus infections.   You may also benefit from eye drops such as: Patanol (or generic olopatadine) eye drops per package instructions.   You might also use artificial tears in between the medicine eye drops to help soothe your eye irritation.   HOME CARE:  You can use an over-the-counter saline nasal spray as needed Avoid areas where there is heavy dust, mites, or molds Stay indoors on windy days during the pollen season Keep windows closed in home, at least in bedroom; use air conditioner. Use high-efficiency house air filter Keep windows closed in car, turn AC on re-circulate Avoid playing out with dog during pollen season  GET HELP RIGHT AWAY IF:  If your symptoms do not improve within 10 days You become short of breath You develop yellow or green discharge from your nose for over 3 days You have coughing fits  MAKE SURE YOU:  Understand these instructions Will watch your condition Will get help right away if you are not doing well or get worse  Thank you for choosing an e-visit. Your e-visit answers were reviewed by a board certified advanced clinical practitioner to complete your personal care plan. Depending upon the condition, your plan could have included both over the counter or prescription medications. Please review your pharmacy choice. Be sure that the pharmacy you have chosen is open so that you can pick up your prescription now.  If there is a problem  you may message your provider in MyChart to have the prescription routed to another pharmacy. Your safety is important to [place]. If you have drug allergies check your prescription carefully.  For the next 24 hours, you can use MyChart to ask questions about today's visit, request a non-urgent call back, or ask for a work or school excuse from your e-visit provider. You will get an email in the next two days asking about your experience. I hope that your e-visit has been valuable and will speed your recovery.  I have  spent 5 minutes in review of e-visit questionnaire, review and updating patient chart, medical decision making and response to patient.   Rica Mast, PhD, FNP-BC

## 2022-02-04 ENCOUNTER — Telehealth: Payer: Medicaid Other | Admitting: Family

## 2022-02-04 DIAGNOSIS — R112 Nausea with vomiting, unspecified: Secondary | ICD-10-CM

## 2022-02-04 MED ORDER — ONDANSETRON HCL 4 MG PO TABS: 4.0000 mg | ORAL_TABLET | Freq: Three times a day (TID) | ORAL | 0 refills | Status: DC | PRN

## 2022-02-04 NOTE — Progress Notes (Signed)
Will follow up with chest pain and have patient explain in more detail before treatment plan.   Evelina Dun, FNP

## 2022-02-04 NOTE — Progress Notes (Signed)
E-Visit for Vomiting  We are sorry that you are not feeling well. Here is how we plan to help!  Based on what you have shared with me it looks like you have a Virus that is irritating your GI tract.  Vomiting is the forceful emptying of a portion of the stomach's content through the mouth.  Although nausea and vomiting can make you feel miserable, it's important to remember that these are not diseases, but rather symptoms of an underlying illness.  When we treat short term symptoms, we always caution that any symptoms that persist should be fully evaluated in a medical office.  I have prescribed a medication that will help alleviate your symptoms and allow you to stay hydrated:  Zofran 4 mg 1 tablet every 8 hours as needed for nausea and vomiting  HOME CARE: Drink clear liquids.  This is very important! Dehydration (the lack of fluid) can lead to a serious complication.  Start off with 1 tablespoon every 5 minutes for 8 hours. You may begin eating bland foods after 8 hours without vomiting.  Start with saltine crackers, white bread, rice, mashed potatoes, applesauce. After 48 hours on a bland diet, you may resume a normal diet. Try to go to sleep.  Sleep often empties the stomach and relieves the need to vomit.  GET HELP RIGHT AWAY IF:  Your symptoms do not improve or worsen within 2 days after treatment. You have a fever for over 3 days. You cannot keep down fluids after trying the medication.  MAKE SURE YOU:  Understand these instructions. Will watch your condition. Will get help right away if you are not doing well or get worse.   Thank you for choosing an e-visit.  Your e-visit answers were reviewed by a board certified advanced clinical practitioner to complete your personal care plan. Depending upon the condition, your plan could have included both over the counter or prescription medications.  Please review your pharmacy choice. Make sure the pharmacy is open so you can pick  up prescription now. If there is a problem, you may contact your provider through MyChart messaging and have the prescription routed to another pharmacy.  Your safety is important to us. If you have drug allergies check your prescription carefully.   For the next 24 hours you can use MyChart to ask questions about today's visit, request a non-urgent call back, or ask for a work or school excuse. You will get an email in the next two days asking about your experience. I hope that your e-visit has been valuable and will speed your recovery.   Approximately 5 minutes was spent documenting and reviewing patient's chart.    

## 2022-02-04 NOTE — Addendum Note (Signed)
Addended by: Evelina Dun A on: 02/04/2022 01:41 PM   Modules accepted: Orders

## 2022-02-09 ENCOUNTER — Telehealth: Payer: Medicaid Other | Admitting: Physician Assistant

## 2022-02-09 DIAGNOSIS — M545 Low back pain, unspecified: Secondary | ICD-10-CM

## 2022-02-09 MED ORDER — NAPROXEN 500 MG PO TABS: 500.0000 mg | ORAL_TABLET | Freq: Two times a day (BID) | ORAL | 0 refills | Status: DC

## 2022-02-09 MED ORDER — CYCLOBENZAPRINE HCL 10 MG PO TABS: 5.0000 mg | ORAL_TABLET | Freq: Three times a day (TID) | ORAL | 0 refills | Status: DC | PRN

## 2022-02-09 NOTE — Progress Notes (Signed)

## 2022-02-13 IMAGING — DX DG CHEST 2V
2 series · 2 of 2 positions shown · non-contrast
Comparison: None.

CLINICAL DATA: Pain with deep breaths

EXAM:
CHEST - 2 VIEW

[w chest pa]
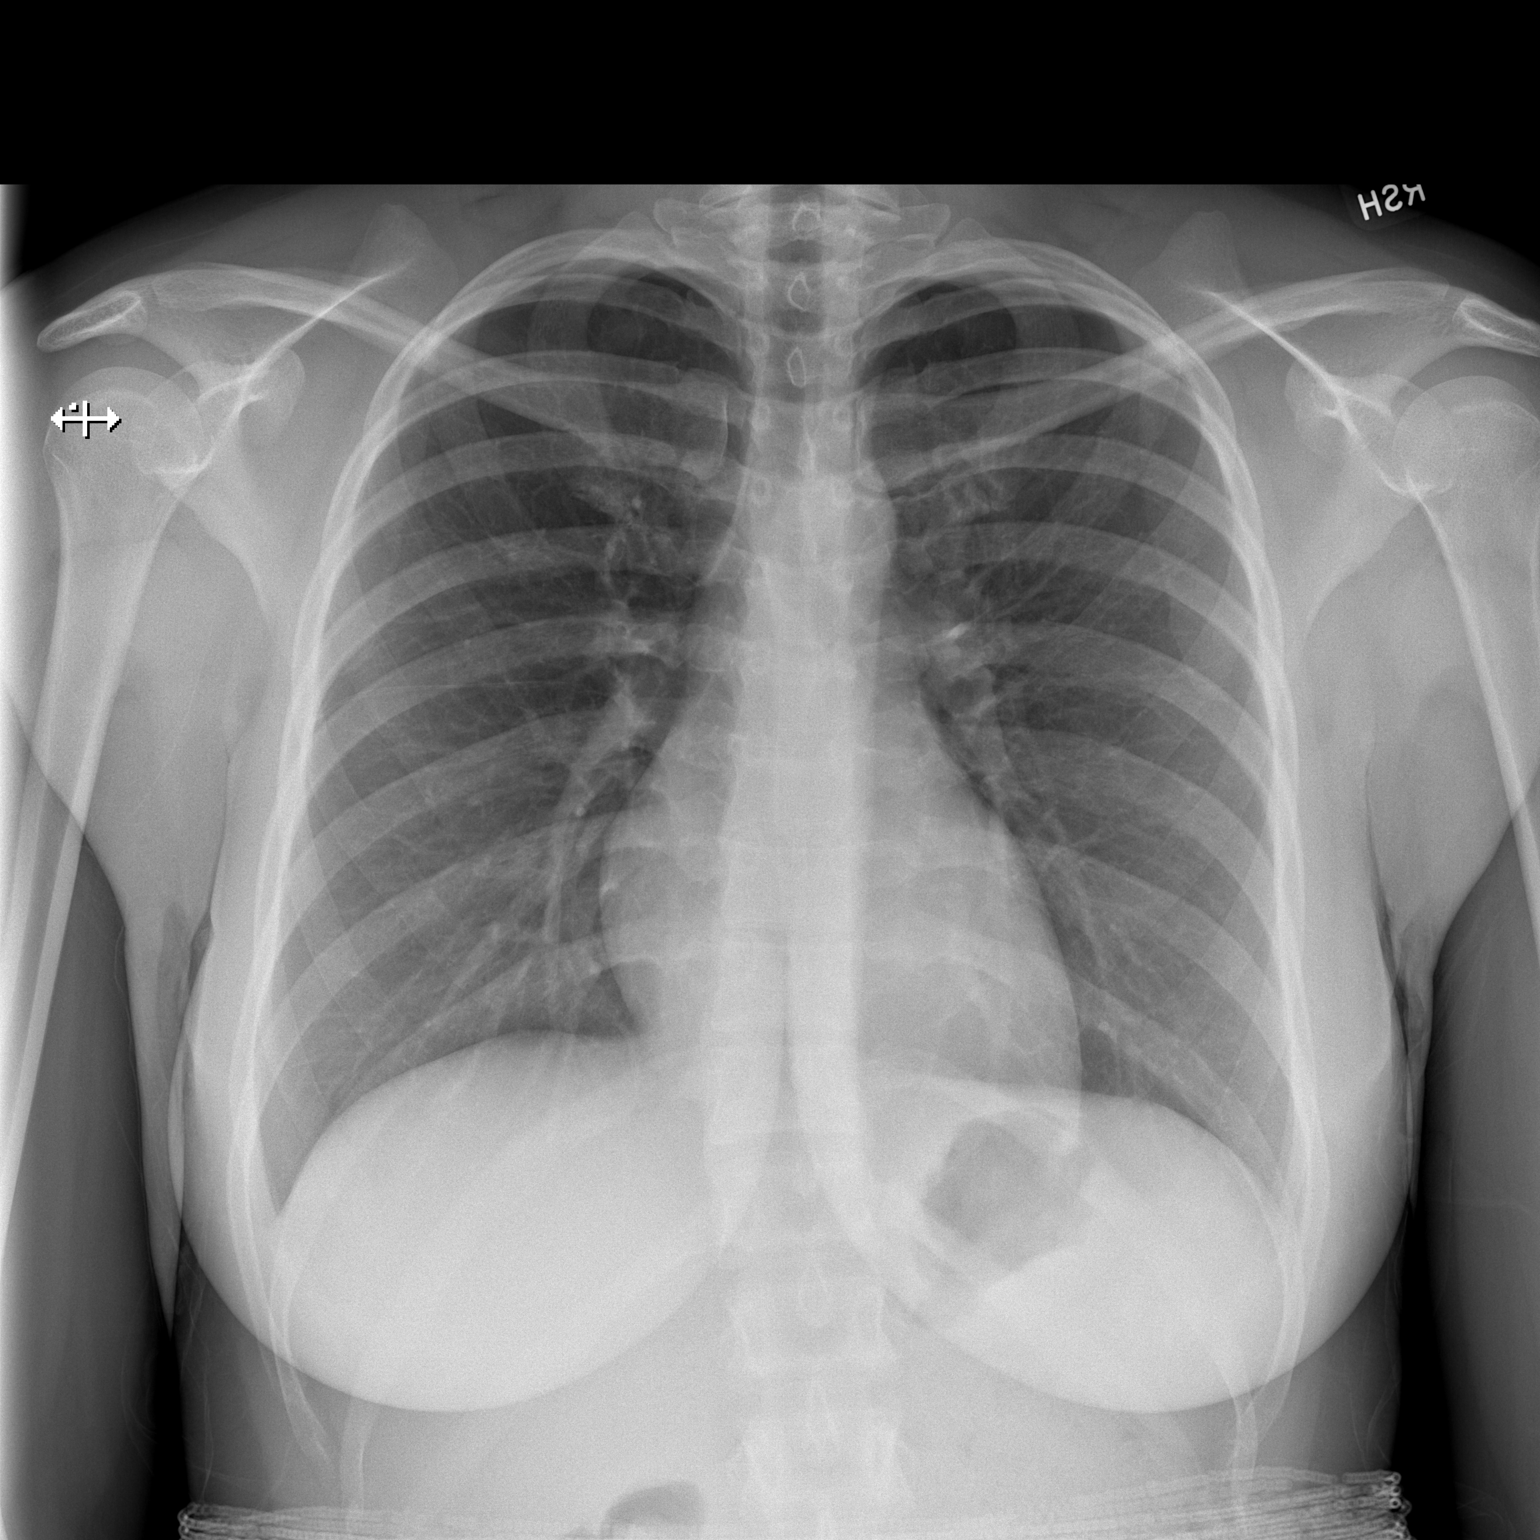

[w chest lat]
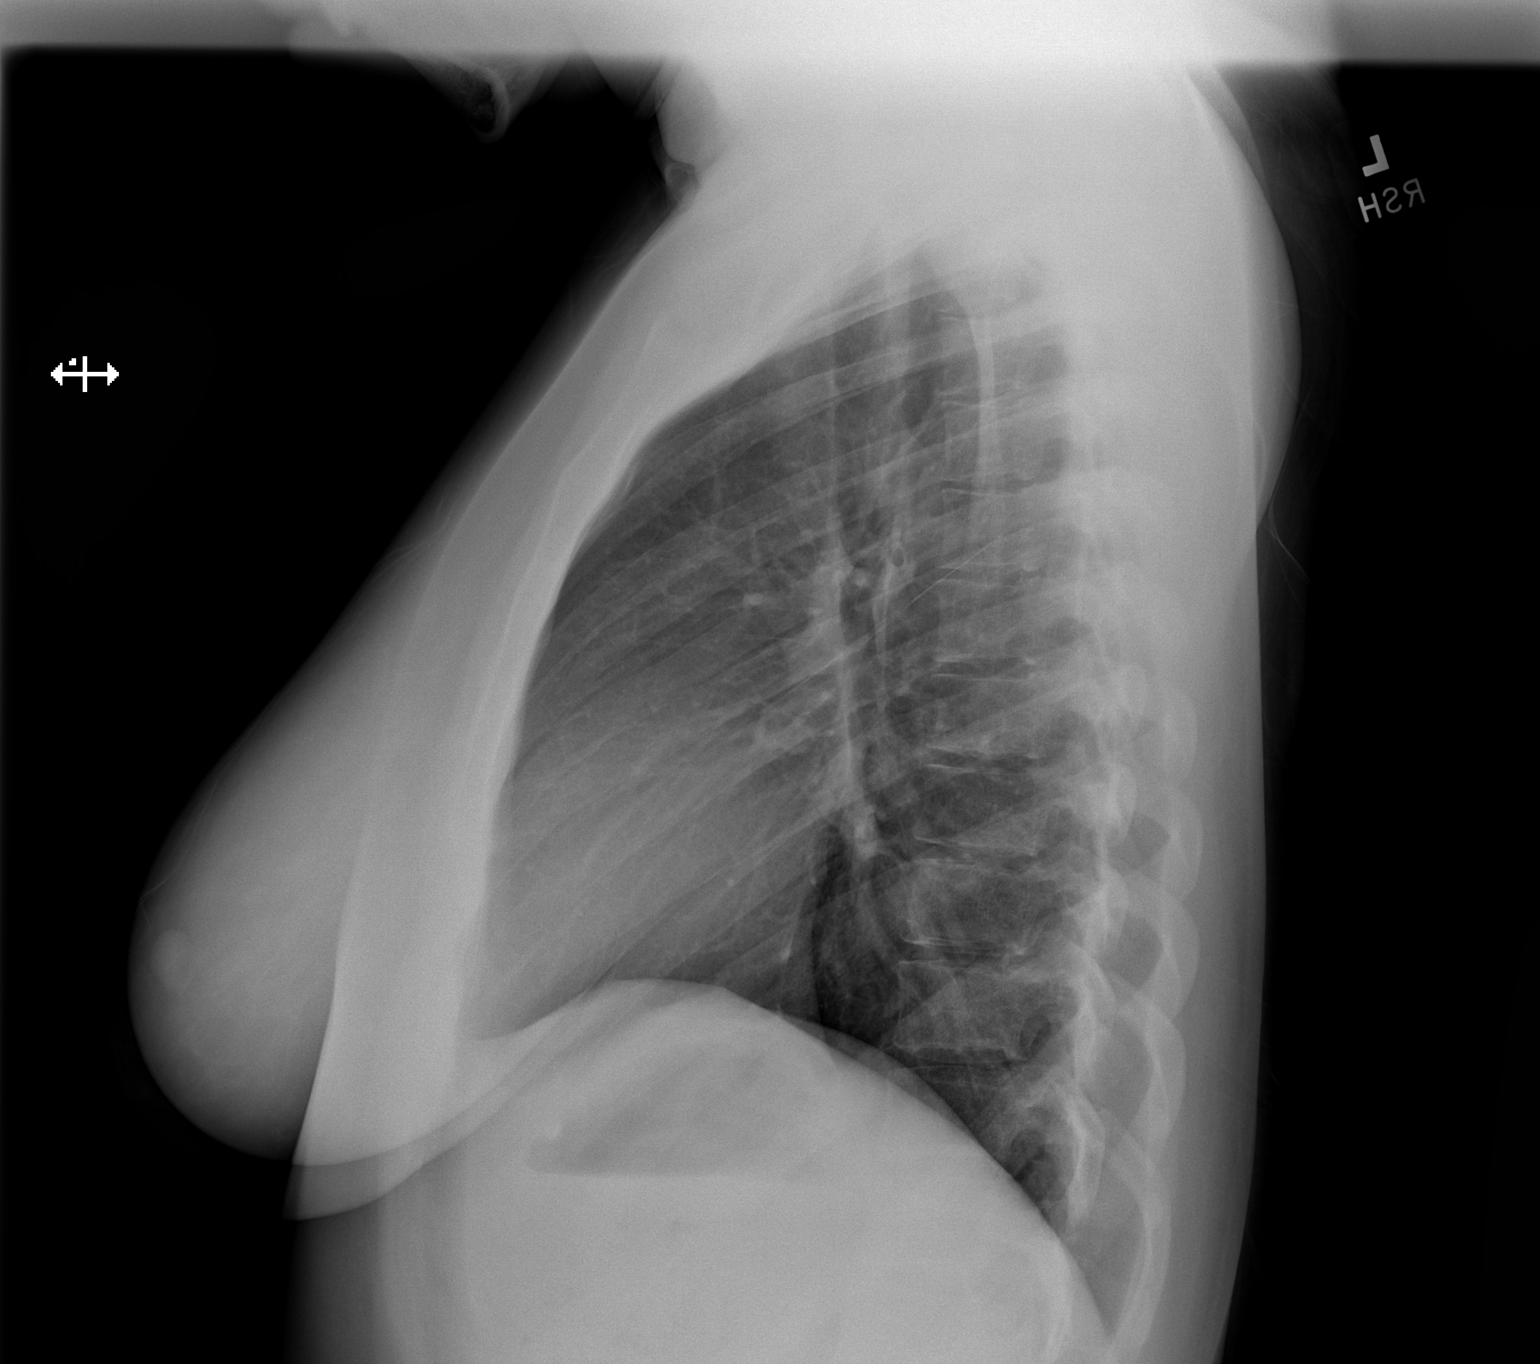

[2 of 2 positions shown; findings below may reference images not displayed]

FINDINGS: No evidence no consolidation. No visible pleural effusions or
pneumothorax. Cardiomediastinal silhouette is within normal limits.
No evidence of acute osseous abnormality.
IMPRESSION: Of acute cardiopulmonary disease.

## 2022-03-08 ENCOUNTER — Telehealth: Payer: Medicaid Other | Admitting: Physician Assistant

## 2022-03-08 DIAGNOSIS — B9689 Other specified bacterial agents as the cause of diseases classified elsewhere: Secondary | ICD-10-CM

## 2022-03-08 DIAGNOSIS — J019 Acute sinusitis, unspecified: Secondary | ICD-10-CM

## 2022-03-09 MED ORDER — AMOXICILLIN-POT CLAVULANATE 875-125 MG PO TABS: 1.0000 | ORAL_TABLET | Freq: Two times a day (BID) | ORAL | 0 refills | Status: DC

## 2022-03-09 NOTE — Progress Notes (Signed)

## 2022-03-18 ENCOUNTER — Telehealth: Payer: Medicaid Other | Admitting: Physician Assistant

## 2022-03-18 DIAGNOSIS — R3989 Other symptoms and signs involving the genitourinary system: Secondary | ICD-10-CM | POA: Diagnosis not present

## 2022-03-18 MED ORDER — CEPHALEXIN 500 MG PO CAPS: 500.0000 mg | ORAL_CAPSULE | Freq: Two times a day (BID) | ORAL | 0 refills | Status: DC

## 2022-03-18 NOTE — Progress Notes (Signed)

## 2022-04-09 NOTE — L&D Delivery Note (Signed)
Operative Delivery Note At 11:59 AM a viable female was delivered via Vaginal, Spontaneous.  Presentation: vertex; Position: Left,, Occiput,, Anterior; Station: +5.  Delivery of the head: easily and without traction Shoulders noted to not come easily with gentle traction.   Second maneuver: ,  McRoberts Third maneuver: ,  suprapubic pressure Fourth maneuver: posterior arm   Verbal consent: obtained from patient.  APGAR: 9, 9; weight  pending Placenta status: routine .   Cord:  with the following complications: .  Cord pH: not sent  Anesthesia:   Episiotomy: None Lacerations: None Suture Repair:  None Est. Blood Loss (mL): 107cc   Mom to postpartum.  Baby to Couplet care / Skin to Skin.  Ranae Pila 11/08/2022, 12:21 PM

## 2022-04-10 ENCOUNTER — Telehealth: Payer: Medicaid Other | Admitting: Family Medicine

## 2022-04-10 DIAGNOSIS — R3989 Other symptoms and signs involving the genitourinary system: Secondary | ICD-10-CM

## 2022-04-10 NOTE — Progress Notes (Signed)
Rose   Because you recently had a UTI in the last month- you need to have follow up urine collection, I feel your condition warrants further evaluation and I recommend that you be seen in a face to face visit.   NOTE: There will be NO CHARGE for this eVisit

## 2022-04-17 ENCOUNTER — Telehealth: Payer: Medicaid Other | Admitting: Physician Assistant

## 2022-04-17 DIAGNOSIS — L308 Other specified dermatitis: Secondary | ICD-10-CM

## 2022-04-17 MED ORDER — HYDROCORTISONE 0.5 % EX CREA: 1.0000 | TOPICAL_CREAM | Freq: Two times a day (BID) | CUTANEOUS | 0 refills | Status: DC

## 2022-04-17 MED ORDER — CLOBETASOL PROPIONATE 0.05 % EX SOLN: 1.0000 | Freq: Two times a day (BID) | CUTANEOUS | 0 refills | Status: DC

## 2022-04-17 NOTE — Progress Notes (Signed)
E-Visit for Eczema  We are sorry that you are not feeling well. Here is how we plan to help! Based on what you shared with me it looks like you have eczema (atopic dermatitis).  Although the cause of eczema is not completely understood, genetics appear to play a strong role, and people with a family history of eczema are at increased risk of developing the condition. In most people with eczema, there is a genetic abnormality in the outermost layer of the skin, called the epidermis   Most people with eczema develop their first symptoms as children, before the age of 24. Intense itching of the skin, patches of redness, small bumps, and skin flaking are common. Scratching can further inflame the skin and worsen the itching. The itchiness may be more noticeable at nighttime.  Eczema commonly affects the back of the neck, the elbow creases, and the backs of the knees. Other affected areas may include the face, wrists, and forearms. The skin may become thickened and darkened, or even scarred, from repeated scratching. Eliminating factors that aggravate your eczema symptoms can help to control the symptoms. Possible triggers may include: ? Cold or dry environments ? Sweating ? Emotional stress or anxiety ? Rapid temperature changes ? Exposure to certain chemicals or cleaning solutions, including soaps and detergents, perfumes and cosmetics, wool or synthetic fibers, dust, sand, and cigarette smoke Keeping your skin hydrated Emollients -- Emollients are creams and ointments that moisturize the skin and prevent it from drying out. The best emollients for people with eczema are thick creams (such as Eucerin, Cetaphil, and Nutraderm) or ointments (such as petroleum jelly, Aquaphor, and Vaseline), which contain little to no water. Emollients are most effective when applied immediately after bathing. Emollients can be applied twice a day or more often if needed. Lotions contain more water than creams and  ointments and are less effective for moisturizing the skin. Bathing -- It is not clear if showers or baths are better for keeping the skin hydrated. Lukewarm baths or showers can hydrate and cool the skin, temporarily relieving itching from eczema. An unscented, mild soap or non-soap cleanser (such as Cetaphil) should be used sparingly. Apply an emollient immediately after bathing or showering to prevent your skin from drying out as a result of water evaporation. Emollient bath additives (products you add to the bath water) have not been found to help relieve symptoms. Hot or long baths (more than 10 to 15 minutes) and showers should be avoided since they can dry out the skin.  Based on what you shared with me you may have eczema.   I have prescribed: Hydrocortisone cream 0.5% for eczema on your eyelids. Take extreme caution to not get any cream in the eye as this can cause blindness. Use topical moisturizers such as aquaphor or eucerin and save the hydrocortisone for extreme eczema to reduce risk of complications from steroid creams around the eye. The eyelid skin is 4 times thinner than other skin on the face, so only very mild and low dose steroid creams can be used there.   I recommend dilute bleach baths for people with eczema. These baths help to decrease the number of bacteria on the skin that can cause infections or worsen symptoms. To prepare a bleach bath, one-fourth to one-half cup of bleach is placed in a full bathtub (about 40 gallons) of water. Bleach baths are usually taken for 5 to 10 minutes twice per week and should be followed by application of an emollient (  listed above). I recommend you take Benadryl 25mg  - 50mg  every 4 hours to control the symptoms (including itching) but if they last over 24 hours it is best that you see an office based provider for follow up.  HOME CARE: Take lukewarm showers or baths Apply creams and ointments to prevent the skin from drying (Eucerin, Cetaphil,  Nutraderm, petroleum jelly, Aquaphor or Vaseline) - these products contain less water than other lotions and are more effective for moisturizing the skin Limit exposure to cold or dry environments, sweating, emotional stress and anxiety, rapid temperature changes and exposure to chemicals and cleaning products, soaps and detergents, perfumes, cosmetics, wool and synthetic fibers, dust, sand and cigarette- factors which can aggravate eczema symptoms.  Use a hydrocortisone cream once or twice a day Take an antihistamine like Benadryl for widespread rashes that itch.  The adult dosage of Benadryl is 25-50 mg by mouth 4 times daily. Caution: This type of medication may cause sleepiness.  Do not drink alcohol, drive, or operate dangerous machinery while taking antihistamines.  Do not take these medications if you have prostate enlargement.  Read the package instructions thoroughly on all medications that you take.  GET HELP RIGHT AWAY IF: Symptoms that don't go away after treatment. Severe itching that persists. You develop a fever. Your skin begins to drain. You have a sore throat. You become short of breath.  MAKE SURE YOU   Understand these instructions. Will watch your condition. Will get help right away if you are not doing well or get worse.    Thank you for choosing an e-visit.  Your e-visit answers were reviewed by a board certified advanced clinical practitioner to complete your personal care plan. Depending upon the condition, your plan could have included both over the counter or prescription medications.  Please review your pharmacy choice. Make sure the pharmacy is open so you can pick up prescription now. If there is a problem, you may contact your provider through and have the prescription routed to another pharmacy.  Your safety is important to . If you have drug allergies check your prescription carefully.   For the next 24 hours you can use MyChart to ask  questions about today's visit, request a non-urgent call back, or ask for a work or school excuse. You will get an email in the next two days asking about your experience. I hope that your e-visit has been valuable and will speed your recovery.  I have spent 5 minutes in review of e-visit questionnaire, review and updating patient chart, medical decision making and response to patient.   [employer], PA-C

## 2022-04-17 NOTE — Addendum Note (Signed)
Addended by: Mar Daring on: 04/17/2022 07:31 AM   Modules accepted: Orders

## 2022-04-25 LAB — HEPATITIS C ANTIBODY: HCV Ab: NEGATIVE

## 2022-04-25 LAB — OB RESULTS CONSOLE HIV ANTIBODY (ROUTINE TESTING): HIV: NONREACTIVE

## 2022-04-25 LAB — OB RESULTS CONSOLE HEPATITIS B SURFACE ANTIGEN: Hepatitis B Surface Ag: NEGATIVE

## 2022-05-13 ENCOUNTER — Telehealth: Payer: Medicaid Other | Admitting: Family

## 2022-05-13 DIAGNOSIS — Z3490 Encounter for supervision of normal pregnancy, unspecified, unspecified trimester: Secondary | ICD-10-CM

## 2022-05-13 DIAGNOSIS — N898 Other specified noninflammatory disorders of vagina: Secondary | ICD-10-CM

## 2022-05-13 NOTE — Progress Notes (Signed)
Because your are pregnant and having vaginal discharge, I feel your condition warrants further evaluation and I recommend that you be seen in a face to face visit.   NOTE: There will be NO CHARGE for this eVisit   If you are having a true medical emergency please call 911.      For an urgent face to face visit, [facility] has eight urgent care centers for your convenience:   NEW!! [facility] [place] [facility] [place] Get Driving Directions [phone] [address] [place], [place] [facility] [place] [facility] [place] Get Driving Directions [phone] [address] [place], [place] [address]   [facility]) Get Driving Directions [phone] [address], [place] [address]  [facility] [place] [facility]) Get Driving Directions [phone] [address] [place],  [place]  [address]  [facility] [place] [facility] - at [address] Get Driving Directions  [phone] [address] [place],  [place] [address]   [facility] Get Driving Directions [phone] [address], [place], [place] [address]   [facility] Get Driving Directions  [phone] [address] [place], [place] [address]   [facility] [place] Get Driving Directions [phone] [address] [place],  [address]  Your MyChart E-visit questionnaire answers were reviewed by a board certified advanced clinical practitioner to complete your personal care plan based on your specific symptoms.  Thank you for using e-Visits.

## 2022-05-25 ENCOUNTER — Encounter: Payer: Self-pay | Admitting: Student

## 2022-05-25 ENCOUNTER — Inpatient Hospital Stay (HOSPITAL_COMMUNITY)
Admission: AD | Admit: 2022-05-25 | Discharge: 2022-05-25 | Disposition: A | Discharge status: Home / Self Care | Payer: Federal, State, Local not specified - PPO | Attending: Obstetrics and Gynecology | Admitting: Obstetrics and Gynecology

## 2022-05-25 DIAGNOSIS — O219 Vomiting of pregnancy, unspecified: Secondary | ICD-10-CM | POA: Insufficient documentation

## 2022-05-25 DIAGNOSIS — R12 Heartburn: Secondary | ICD-10-CM | POA: Insufficient documentation

## 2022-05-25 DIAGNOSIS — O26892 Other specified pregnancy related conditions, second trimester: Secondary | ICD-10-CM

## 2022-05-25 DIAGNOSIS — Z3A13 13 weeks gestation of pregnancy: Secondary | ICD-10-CM | POA: Insufficient documentation

## 2022-05-25 DIAGNOSIS — O26891 Other specified pregnancy related conditions, first trimester: Secondary | ICD-10-CM | POA: Insufficient documentation

## 2022-05-25 LAB — URINALYSIS, ROUTINE W REFLEX MICROSCOPIC
Bilirubin Urine: NEGATIVE
Glucose, UA: NEGATIVE mg/dL
Hgb urine dipstick: NEGATIVE
Ketones, ur: 20 mg/dL — AB
Leukocytes,Ua: NEGATIVE
Nitrite: NEGATIVE
Protein, ur: NEGATIVE mg/dL
Specific Gravity, Urine: 1.023 (ref 1.005–1.030)
pH: 5 (ref 5.0–8.0)

## 2022-05-25 MED ORDER — ALUM & MAG HYDROXIDE-SIMETH 200-200-20 MG/5ML PO SUSP
30.0000 mL | Freq: Once | ORAL | Status: AC
  Administered 2022-05-25: 30 mL via ORAL
  Filled 2022-05-25: qty 30

## 2022-05-25 MED ORDER — ONDANSETRON 4 MG PO TBDP
8.0000 mg | ORAL_TABLET | Freq: Once | ORAL | Status: AC
  Administered 2022-05-25: 8 mg via ORAL
  Filled 2022-05-25: qty 2

## 2022-05-25 MED ORDER — ONDANSETRON HCL 4 MG PO TABS: 4.0000 mg | ORAL_TABLET | Freq: Three times a day (TID) | ORAL | 0 refills | Status: DC | PRN

## 2022-05-25 MED ORDER — PROMETHAZINE HCL 25 MG PO TABS: 25.0000 mg | ORAL_TABLET | Freq: Four times a day (QID) | ORAL | 0 refills | Status: DC | PRN

## 2022-05-25 MED ORDER — SCOPOLAMINE 1 MG/3DAYS TD PT72
1.0000 | MEDICATED_PATCH | TRANSDERMAL | Status: DC
  Administered 2022-05-25: 1.5 mg via TRANSDERMAL
  Filled 2022-05-25: qty 1

## 2022-05-25 MED ORDER — METOCLOPRAMIDE HCL 10 MG PO TABS: 10.0000 mg | ORAL_TABLET | Freq: Four times a day (QID) | ORAL | 0 refills | Status: DC

## 2022-05-25 MED ORDER — SCOPOLAMINE 1 MG/3DAYS TD PT72: 1.0000 | MEDICATED_PATCH | TRANSDERMAL | 12 refills | Status: DC

## 2022-05-25 MED ORDER — FAMOTIDINE 20 MG PO TABS: 20.0000 mg | ORAL_TABLET | Freq: Two times a day (BID) | ORAL | 0 refills | Status: DC

## 2022-05-25 MED ORDER — LIDOCAINE VISCOUS HCL 2 % MT SOLN
15.0000 mL | Freq: Once | OROMUCOSAL | Status: AC
  Administered 2022-05-25: 15 mL via ORAL
  Filled 2022-05-25: qty 15

## 2022-05-25 NOTE — MAU Note (Signed)
.  April Osborn is a 32 y.o. at 27w1dhere in MAU reporting: vomiting non stop since this am, two episodes of diarrhea this am. Also complains of chest pain (upper mid) since the vomiting started.also reports dizziness that started after vomiting started. Mid upper abd pain , denies lower abd pain. Denies vaginal bleeding.  Onset of complaint: today Pain score: 8/10 upper abd and chest Vitals:   05/25/22 1350  BP: 115/61  Pulse: 93  Resp: 15  Temp: 98.6 F (37 C)  SpO2: 98%      Lab orders placed from triage:

## 2022-05-25 NOTE — MAU Provider Note (Signed)
History     CSN: [id number]  Arrival date and time: 05/25/22 1301   Event Date/Time   First Provider Initiated Contact with Patient 05/25/22 1404      Chief Complaint  Patient presents with   Abdominal Pain   Nausea   Emesis   Chest Pain   HPI  April Osborn is a 32 y.o. [id number] at 61w1dwho presents for evaluation of nausea and vomiting. Patient reports she has had "non stop" vomiting since this morning. She also had 2 episodes of diarrhea. She reports the vomiting is causing burning in her chest. She has not taken anything for the vomiting or burning. She reports she has been around her son who has been sick. She denies any vaginal bleeding, discharge, and leaking of fluid. Denies any constipation, diarrhea or any urinary complaints.   OB History     Gravida  3   Para  2   Term  1   Preterm  1   AB      Living  2      SAB      IAB      Ectopic      Multiple  0   Live Births  1           Past Medical History:  Diagnosis Date   Medical history non-contributory     Past Surgical History:  Procedure Laterality Date   NO PAST SURGERIES      Family History  Problem Relation Age of Onset   Cancer Maternal Grandmother     Social History   Tobacco Use   Smoking status: Never   Smokeless tobacco: Never  Vaping Use   Vaping Use: Never used  Substance Use Topics   Alcohol use: Never   Drug use: Never    Allergies: No Known Allergies  No medications prior to admission.    Review of Systems  Constitutional: Negative.  Negative for fatigue and fever.  HENT: Negative.    Respiratory: Negative.  Negative for shortness of breath.   Cardiovascular: Negative.  Negative for chest pain.  Gastrointestinal:  Positive for nausea and vomiting. Negative for abdominal pain, constipation and diarrhea.  Genitourinary: Negative.  Negative for dysuria, vaginal bleeding and vaginal discharge.  Neurological: Negative.  Negative for dizziness and  headaches.   Physical Exam   Blood pressure 121/68, pulse 99, temperature 98.6 F (37 C), temperature source Oral, resp. rate 16, height 5' 8"$  (1.727 m), weight 96.6 kg, last menstrual period 02/05/2022, SpO2 100 %, unknown if currently breastfeeding.  Patient Vitals for the past 24 hrs:  BP Temp Temp src Pulse Resp SpO2 Height Weight  05/25/22 1634 121/68 -- -- 99 16 100 % -- --  05/25/22 1350 115/61 98.6 F (37 C) Oral 93 15 98 % 5' 8"$  (1.727 m) 96.6 kg    Physical Exam Vitals and nursing note reviewed.  Constitutional:      General: She is not in acute distress.    Appearance: She is well-developed.  HENT:     Head: Normocephalic.  Eyes:     Pupils: Pupils are equal, round, and reactive to light.  Cardiovascular:     Rate and Rhythm: Normal rate and regular rhythm.     Heart sounds: Normal heart sounds.  Pulmonary:     Effort: Pulmonary effort is normal. No respiratory distress.     Breath sounds: Normal breath sounds.  Abdominal:     General: Bowel sounds are normal. There  is no distension.     Palpations: Abdomen is soft.     Tenderness: There is no abdominal tenderness.  Skin:    General: Skin is warm and dry.  Neurological:     Mental Status: She is alert and oriented to person, place, and time.  Psychiatric:        Mood and Affect: Mood normal.        Behavior: Behavior normal.        Thought Content: Thought content normal.        Judgment: Judgment normal.      MAU Course  Procedures  Results for orders placed or performed during the hospital encounter of 05/25/22 (from the past 24 hour(s))  Urinalysis, Routine w reflex microscopic -Urine, Clean Catch     Status: Abnormal   Collection Time: 05/25/22  1:38 PM  Result Value Ref Range   Color, Urine YELLOW YELLOW   APPearance HAZY (A) CLEAR   Specific Gravity, Urine 1.023 1.005 - 1.030   pH 5.0 5.0 - 8.0   Glucose, UA NEGATIVE NEGATIVE mg/dL   Hgb urine dipstick NEGATIVE NEGATIVE   Bilirubin Urine  NEGATIVE NEGATIVE   Ketones, ur 20 (A) NEGATIVE mg/dL   Protein, ur NEGATIVE NEGATIVE mg/dL   Nitrite NEGATIVE NEGATIVE   Leukocytes,Ua NEGATIVE NEGATIVE     No results found.   MDM Labs ordered and reviewed.   UA Zofran ODT Scop patch No episodes of vomiting while in MAU  GI cocktail  Pt informed that the ultrasound is considered a limited OB ultrasound and is not intended to be a complete ultrasound exam.  Patient also informed that the ultrasound is not being completed with the intent of assessing for fetal or placental anomalies or any pelvic abnormalities.  Explained that the purpose of today's ultrasound is to assess for  viability.  Patient acknowledges the purpose of the exam and the limitations of the study.    Live IUP with FHR 162 bpm  Patient reports resolution of symptoms while in MAU  Assessment and Plan   1. Nausea and vomiting during pregnancy   2. Heartburn during pregnancy in second trimester   3. [redacted] weeks gestation of pregnancy     -Discharge home in stable condition -Rx for antiemetics sent to pharmacy -Second trimester precautions discussed -Patient advised to follow-up with OB as scheduled for prenatal care -Patient may return to MAU as needed or if her condition were to change or worsen  Wende Mott, CNM 05/25/2022, 2:04 PM

## 2022-05-25 NOTE — Discharge Instructions (Signed)

## 2022-05-29 ENCOUNTER — Telehealth: Payer: Federal, State, Local not specified - PPO | Admitting: Physician Assistant

## 2022-05-29 DIAGNOSIS — J029 Acute pharyngitis, unspecified: Secondary | ICD-10-CM | POA: Diagnosis not present

## 2022-05-29 NOTE — Progress Notes (Signed)
E-Visit for Sore Throat  We are sorry that you are not feeling well.  Here is how we plan to help!  Your symptoms indicate a likely viral infection (Pharyngitis).   Pharyngitis is inflammation in the back of the throat which can cause a sore throat, scratchiness and sometimes difficulty swallowing.   Pharyngitis is typically caused by a respiratory virus and will just run its course.  Please keep in mind that your symptoms could last up to 10 days.  For throat pain, we recommend over the counter oral pain relief medications such as acetaminophen or aspirin, or anti-inflammatory medications such as ibuprofen or naproxen sodium.  Topical treatments such as oral throat lozenges or sprays may be used as needed.  Avoid close contact with loved ones, especially the very young and elderly.  Remember to wash your hands thoroughly throughout the day as this is the number one way to prevent the spread of infection and wipe down door knobs and counters with disinfectant.  After careful review of your answers, I would not recommend an antibiotic for your condition.  Antibiotics should not be used to treat conditions that we suspect are caused by viruses like the virus that causes the common cold or flu. However, some people can have Strep with atypical symptoms. You may need formal testing in clinic or office to confirm if your symptoms continue or worsen.  Providers prescribe antibiotics to treat infections caused by bacteria. Antibiotics are very powerful in treating bacterial infections when they are used properly.  To maintain their effectiveness, they should be used only when necessary.  Overuse of antibiotics has resulted in the development of super bugs that are resistant to treatment!    Home Care: Only take medications as instructed by your medical team. Do not drink alcohol while taking these medications. A steam or ultrasonic humidifier can help congestion.  You can place a towel over your head and  breathe in the steam from hot water coming from a faucet. Avoid close contacts especially the very young and the elderly. Cover your mouth when you cough or sneeze. Always remember to wash your hands.  Get Help Right Away If: You develop worsening fever or throat pain. You develop a severe head ache or visual changes. Your symptoms persist after you have completed your treatment plan.  Make sure you Understand these instructions. Will watch your condition. Will get help right away if you are not doing well or get worse.   Thank you for choosing an e-visit.  Your e-visit answers were reviewed by a board certified advanced clinical practitioner to complete your personal care plan. Depending upon the condition, your plan could have included both over the counter or prescription medications.  Please review your pharmacy choice. Make sure the pharmacy is open so you can pick up prescription now. If there is a problem, you may contact your provider through [employer] and have the prescription routed to another pharmacy.  Your safety is important to [place]. If you have drug allergies check your prescription carefully.   For the next 24 hours you can use MyChart to ask questions about today's visit, request a non-urgent call back, or ask for a work or school excuse. You will get an email in the next two days asking about your experience. I hope that your e-visit has been valuable and will speed your recovery.  I have spent 5 minutes in review of e-visit questionnaire, review and updating patient chart, medical decision making and response  to patient.   Mar Daring, PA-C

## 2022-06-08 ENCOUNTER — Observation Stay (HOSPITAL_COMMUNITY)
Admission: AD | Admit: 2022-06-08 | Discharge: 2022-06-10 | Disposition: A | Discharge status: Home / Self Care | Payer: Federal, State, Local not specified - PPO | Attending: Obstetrics and Gynecology | Admitting: Obstetrics and Gynecology

## 2022-06-08 ENCOUNTER — Other Ambulatory Visit: Payer: Self-pay

## 2022-06-08 ENCOUNTER — Encounter (HOSPITAL_COMMUNITY): Payer: Self-pay | Admitting: Obstetrics and Gynecology

## 2022-06-08 DIAGNOSIS — B9629 Other Escherichia coli [E. coli] as the cause of diseases classified elsewhere: Secondary | ICD-10-CM | POA: Insufficient documentation

## 2022-06-08 DIAGNOSIS — O2302 Infections of kidney in pregnancy, second trimester: Secondary | ICD-10-CM | POA: Diagnosis not present

## 2022-06-08 DIAGNOSIS — O2300 Infections of kidney in pregnancy, unspecified trimester: Secondary | ICD-10-CM | POA: Diagnosis present

## 2022-06-08 DIAGNOSIS — O26892 Other specified pregnancy related conditions, second trimester: Secondary | ICD-10-CM | POA: Diagnosis present

## 2022-06-08 DIAGNOSIS — Z3A15 15 weeks gestation of pregnancy: Secondary | ICD-10-CM | POA: Diagnosis not present

## 2022-06-08 LAB — URINALYSIS, ROUTINE W REFLEX MICROSCOPIC
Bilirubin Urine: NEGATIVE
Glucose, UA: NEGATIVE mg/dL
Hgb urine dipstick: NEGATIVE
Ketones, ur: NEGATIVE mg/dL
Nitrite: POSITIVE — AB
Protein, ur: 100 mg/dL — AB
Specific Gravity, Urine: 1.016 (ref 1.005–1.030)
WBC, UA: >50 WBC/hpf (ref 0–5)
pH: 7 (ref 5.0–8.0)

## 2022-06-08 LAB — CBC WITH DIFFERENTIAL/PLATELET
Abs Immature Granulocytes: 0.05 10*3/uL (ref 0.00–0.07)
Basophils Absolute: 0 10*3/uL (ref 0.0–0.1)
Basophils Relative: 0 %
Eosinophils Absolute: 0.1 10*3/uL (ref 0.0–0.5)
Eosinophils Relative: 1 %
HCT: 36.7 % (ref 36.0–46.0)
Hemoglobin: 12.4 g/dL (ref 12.0–15.0)
Immature Granulocytes: 0 %
Lymphocytes Relative: 13 %
Lymphs Abs: 1.5 10*3/uL (ref 0.7–4.0)
MCH: 30.7 pg (ref 26.0–34.0)
MCHC: 33.8 g/dL (ref 30.0–36.0)
MCV: 90.8 fL (ref 80.0–100.0)
Monocytes Absolute: 0.8 10*3/uL (ref 0.1–1.0)
Monocytes Relative: 7 %
Neutro Abs: 9.4 10*3/uL — ABNORMAL HIGH (ref 1.7–7.7)
Neutrophils Relative %: 79 %
Platelets: 233 10*3/uL (ref 150–400)
RBC: 4.04 MIL/uL (ref 3.87–5.11)
RDW: 12.7 % (ref 11.5–15.5)
WBC: 11.9 10*3/uL — ABNORMAL HIGH (ref 4.0–10.5)
nRBC: 0 % (ref 0.0–0.2)

## 2022-06-08 LAB — COMPREHENSIVE METABOLIC PANEL
ALT: 13 U/L (ref 0–44)
AST: 15 U/L (ref 15–41)
Albumin: 2.8 g/dL — ABNORMAL LOW (ref 3.5–5.0)
Alkaline Phosphatase: 39 U/L (ref 38–126)
Anion gap: 9 (ref 5–15)
BUN: 7 mg/dL (ref 6–20)
CO2: 23 mmol/L (ref 22–32)
Calcium: 8.8 mg/dL — ABNORMAL LOW (ref 8.9–10.3)
Chloride: 104 mmol/L (ref 98–111)
Creatinine, Ser: 0.64 mg/dL (ref 0.44–1.00)
GFR, Estimated: >60 mL/min (ref >60)
Glucose, Bld: 85 mg/dL (ref 70–99)
Potassium: 4 mmol/L (ref 3.5–5.1)
Sodium: 136 mmol/L (ref 135–145)
Total Bilirubin: 0.4 mg/dL (ref 0.3–1.2)
Total Protein: 6.3 g/dL — ABNORMAL LOW (ref 6.5–8.1)

## 2022-06-08 LAB — TYPE AND SCREEN
ABO/RH(D): A POS
Antibody Screen: NEGATIVE

## 2022-06-08 MED ORDER — DOCUSATE SODIUM 100 MG PO CAPS
100.0000 mg | ORAL_CAPSULE | Freq: Every day | ORAL | Status: DC
  Administered 2022-06-09 – 2022-06-10 (×2): 100 mg via ORAL
  Filled 2022-06-08 (×2): qty 1

## 2022-06-08 MED ORDER — SODIUM CHLORIDE 0.9 % IV SOLN
1.0000 g | INTRAVENOUS | Status: DC
  Administered 2022-06-08 – 2022-06-09 (×2): 1 g via INTRAVENOUS
  Filled 2022-06-08 (×2): qty 10

## 2022-06-08 MED ORDER — SODIUM CHLORIDE 0.9 % IV SOLN: INTRAVENOUS | Status: DC | PRN

## 2022-06-08 MED ORDER — CALCIUM CARBONATE ANTACID 500 MG PO CHEW: 2.0000 | CHEWABLE_TABLET | ORAL | Status: DC | PRN

## 2022-06-08 MED ORDER — LACTATED RINGERS IV SOLN: 125.0000 mL/h | INTRAVENOUS | Status: AC

## 2022-06-08 MED ORDER — OXYCODONE-ACETAMINOPHEN 5-325 MG PO TABS
2.0000 | ORAL_TABLET | Freq: Once | ORAL | Status: AC
  Administered 2022-06-08: 2 via ORAL
  Filled 2022-06-08: qty 2

## 2022-06-08 MED ORDER — OXYCODONE HCL 5 MG PO TABS
5.0000 mg | ORAL_TABLET | ORAL | Status: DC | PRN
  Administered 2022-06-08 – 2022-06-09 (×3): 10 mg via ORAL
  Filled 2022-06-08 (×3): qty 2

## 2022-06-08 MED ORDER — ACETAMINOPHEN 325 MG PO TABS
650.0000 mg | ORAL_TABLET | ORAL | Status: DC | PRN
  Administered 2022-06-09: 650 mg via ORAL
  Filled 2022-06-08: qty 2

## 2022-06-08 MED ORDER — PRENATAL MULTIVITAMIN CH
1.0000 | ORAL_TABLET | Freq: Every day | ORAL | Status: DC
  Administered 2022-06-09: 1 via ORAL
  Filled 2022-06-08: qty 1

## 2022-06-08 MED ORDER — LACTATED RINGERS IV SOLN: INTRAVENOUS | Status: DC

## 2022-06-08 MED ORDER — CEFAZOLIN SODIUM-DEXTROSE 1-4 GM/50ML-% IV SOLN: 1.0000 g | Freq: Three times a day (TID) | INTRAVENOUS | Status: DC

## 2022-06-08 NOTE — MAU Provider Note (Signed)
History     CSN: [id number]  Arrival date and time: 06/08/22 1216   Event Date/Time   First Provider Initiated Contact with Patient 06/08/22 1308      Chief Complaint  Patient presents with   cramping   HPI  April Osborn is a 32 y.o. G3P1102 at 63w1dwho presents for evaluation of right sided flank pain. Patient reports the pain started yesterday and has progressively gotten worse. She reports it starts on the lower right side of her abdomen and wraps around to the front.Patient rates the pain as a 10/10 and has tried flexeril for the pain with no relief. She reports she has had chills at home but no fever. She reports feeling pain with urination and going to the bathroom more frequently. She denies any vaginal bleeding, discharge, and leaking of fluid. Denies any constipation, diarrhea.   OB History     Gravida  3   Para  2   Term  1   Preterm  1   AB      Living  2      SAB      IAB      Ectopic      Multiple  0   Live Births  2           Past Medical History:  Diagnosis Date   Medical history non-contributory     Past Surgical History:  Procedure Laterality Date   NO PAST SURGERIES      Family History  Problem Relation Age of Onset   Cancer Maternal Grandmother     Social History   Tobacco Use   Smoking status: Never   Smokeless tobacco: Never  Vaping Use   Vaping Use: Never used  Substance Use Topics   Alcohol use: Never   Drug use: Never    Allergies: No Known Allergies  Medications Prior to Admission  Medication Sig Dispense Refill Last Dose   cyclobenzaprine (FLEXERIL) 10 MG tablet Take 0.5-1 tablets (5-10 mg total) by mouth 3 (three) times daily as needed. 30 tablet 0 06/08/2022   famotidine (PEPCID) 20 MG tablet Take 1 tablet (20 mg total) by mouth 2 (two) times daily. 30 tablet 0 Past Week   metoCLOPramide (REGLAN) 10 MG tablet Take 1 tablet (10 mg total) by mouth every 6 (six) hours. 30 tablet 0 Past Month   ondansetron  (ZOFRAN) 4 MG tablet Take 1 tablet (4 mg total) by mouth every 8 (eight) hours as needed for nausea or vomiting. 20 tablet 0 Past Month   Prenatal Vit-Fe Fumarate-FA (MULTIVITAMIN-PRENATAL) 27-0.8 MG TABS tablet Take 1 tablet by mouth daily at 12 noon.   06/08/2022   promethazine (PHENERGAN) 25 MG tablet Take 1 tablet (25 mg total) by mouth every 6 (six) hours as needed for nausea or vomiting. 30 tablet 0 Past Month   clobetasol (TEMOVATE) 0.05 % external solution Apply 1 Application topically 2 (two) times daily. 50 mL 0    fluticasone (FLONASE) 50 MCG/ACT nasal spray Place 2 sprays into both nostrils daily. (Patient not taking: Reported on 06/08/2022) 16 g 0 Not Taking   hydrocortisone cream 0.5 % Apply 1 Application topically 2 (two) times daily. (Patient not taking: Reported on 06/08/2022) 30 g 0 Not Taking   levocetirizine (XYZAL) 5 MG tablet Take 1 tablet (5 mg total) by mouth every evening. (Patient not taking: Reported on 06/08/2022) 30 tablet 0 Not Taking   scopolamine (TRANSDERM-SCOP) 1 MG/3DAYS Place 1 patch (1.5 mg  total) onto the skin every 3 (three) days. (Patient not taking: Reported on 06/08/2022) 10 patch 12 Not Taking    Review of Systems  Constitutional: Negative.  Negative for fatigue and fever.  HENT: Negative.    Respiratory: Negative.  Negative for shortness of breath.   Cardiovascular: Negative.  Negative for chest pain.  Gastrointestinal:  Positive for abdominal pain. Negative for constipation, diarrhea, nausea and vomiting.  Genitourinary:  Positive for dysuria and urgency. Negative for vaginal bleeding and vaginal discharge.  Musculoskeletal:  Positive for back pain.  Neurological: Negative.  Negative for dizziness and headaches.   Physical Exam   Blood pressure 130/70, pulse 93, temperature 98.4 F (36.9 C), temperature source Oral, resp. rate 17, height '5\' 8"'$  (1.727 m), weight 105 kg, last menstrual period 02/05/2022, SpO2 99 %, unknown if currently breastfeeding.  Patient  Vitals for the past 24 hrs:  BP Temp Temp src Pulse Resp SpO2 Height Weight  06/08/22 1247 130/70 98.4 F (36.9 C) Oral 93 17 99 % -- --  06/08/22 1243 -- -- -- -- -- -- '5\' 8"'$  (1.727 m) 105 kg    Physical Exam Vitals and nursing note reviewed.  Constitutional:      General: She is not in acute distress.    Appearance: She is well-developed.  HENT:     Head: Normocephalic.  Eyes:     Pupils: Pupils are equal, round, and reactive to light.  Cardiovascular:     Rate and Rhythm: Normal rate and regular rhythm.     Heart sounds: Normal heart sounds.  Pulmonary:     Effort: Pulmonary effort is normal. No respiratory distress.     Breath sounds: Normal breath sounds.  Abdominal:     General: Bowel sounds are normal. There is no distension.     Palpations: Abdomen is soft.     Tenderness: There is no abdominal tenderness. There is right CVA tenderness.  Skin:    General: Skin is warm and dry.  Neurological:     Mental Status: She is alert and oriented to person, place, and time.  Psychiatric:        Mood and Affect: Mood normal.        Behavior: Behavior normal.        Thought Content: Thought content normal.        Judgment: Judgment normal.     FHT: 150 bpm   MAU Course  Procedures  Results for orders placed or performed during the hospital encounter of 06/08/22 (from the past 24 hour(s))  Urinalysis, Routine w reflex microscopic -Urine, Clean Catch     Status: Abnormal   Collection Time: 06/08/22 12:58 PM  Result Value Ref Range   Color, Urine YELLOW YELLOW   APPearance CLOUDY (A) CLEAR   Specific Gravity, Urine 1.016 1.005 - 1.030   pH 7.0 5.0 - 8.0   Glucose, UA NEGATIVE NEGATIVE mg/dL   Hgb urine dipstick NEGATIVE NEGATIVE   Bilirubin Urine NEGATIVE NEGATIVE   Ketones, ur NEGATIVE NEGATIVE mg/dL   Protein, ur 100 (A) NEGATIVE mg/dL   Nitrite POSITIVE (A) NEGATIVE   Leukocytes,Ua LARGE (A) NEGATIVE   RBC / HPF 6-10 0 - 5 RBC/hpf   WBC, UA >50 0 - 5 WBC/hpf    Bacteria, UA FEW (A) NONE SEEN   Squamous Epithelial / HPF 0-5 0 - 5 /HPF  CBC with Differential/Platelet     Status: Abnormal   Collection Time: 06/08/22  1:29 PM  Result Value Ref Range  WBC 11.9 (H) 4.0 - 10.5 K/uL   RBC 4.04 3.87 - 5.11 MIL/uL   Hemoglobin 12.4 12.0 - 15.0 g/dL   HCT 36.7 36.0 - 46.0 %   MCV 90.8 80.0 - 100.0 fL   MCH 30.7 26.0 - 34.0 pg   MCHC 33.8 30.0 - 36.0 g/dL   RDW 12.7 11.5 - 15.5 %   Platelets 233 150 - 400 K/uL   nRBC 0.0 0.0 - 0.2 %   Neutrophils Relative % 79 %   Neutro Abs 9.4 (H) 1.7 - 7.7 K/uL   Lymphocytes Relative 13 %   Lymphs Abs 1.5 0.7 - 4.0 K/uL   Monocytes Relative 7 %   Monocytes Absolute 0.8 0.1 - 1.0 K/uL   Eosinophils Relative 1 %   Eosinophils Absolute 0.1 0.0 - 0.5 K/uL   Basophils Relative 0 %   Basophils Absolute 0.0 0.0 - 0.1 K/uL   Immature Granulocytes 0 %   Abs Immature Granulocytes 0.05 0.00 - 0.07 K/uL   MDM Prenatal records from community office not on file. Pregnancy uncomplicated per patient report. Labs ordered and reviewed.   UA, UC Percocet PO  CBC with Diff CMP  Lab notified staff at 1345 that computers were down and no ability to result labs at this time. Unable to give staff ETA of when this will be resolved  CNM consulted with Dr. Roselie Awkward regarding presentation and results- MD recommends admission for IV antibiotics despite no labs being available  Dr. Helane Rima notified of patient arrival and exam findings consistent with pyelonephritis- MD will admit patient to [facility] and Plan   1. Pyelonephritis affecting pregnancy in second trimester   2. [redacted] weeks gestation of pregnancy     -Admit to [facility] turned over to MD  Wende Mott, CNM 06/08/2022, 1:08 PM

## 2022-06-08 NOTE — H&P (Signed)
April Osborn is a 32 y.o. G 3 P 2112 at 15 w 1 day presents with flank pain since last night. Pain has worsened.  No fevers  Some urinary frequency.  Urine positive for nitrates. Rest of labs have been pending for some time and because of system wide problem with labs today these are not immediately available. OB History     Gravida  3   Para  2   Term  1   Preterm  1   AB      Living  2      SAB      IAB      Ectopic      Multiple  0   Live Births  2          Past Medical History:  Diagnosis Date   Medical history non-contributory    Past Surgical History:  Procedure Laterality Date   NO PAST SURGERIES     Family History: family history includes Cancer in her maternal grandmother. Social History:  reports that she has never smoked. She has never used smokeless tobacco. She reports that she does not drink alcohol and does not use drugs.     Maternal Diabetes: No Genetic Screening: Normal Maternal Ultrasounds/Referrals: Normal Fetal Ultrasounds or other Referrals:  None Maternal Substance Abuse:  No Significant Maternal Medications:  None Significant Maternal Lab Results:  None Number of Prenatal Visits:greater than 3 verified prenatal visits Other Comments:  None  Review of Systems  All other systems reviewed and are negative.  History   Blood pressure 130/70, pulse 93, temperature 98.4 F (36.9 C), temperature source Oral, resp. rate 17, height '5\' 8"'$  (1.727 m), weight 105 kg, last menstrual period 02/05/2022, SpO2 99 %, unknown if currently breastfeeding. Exam Physical Exam Vitals and nursing note reviewed. Exam conducted with a chaperone present.  Constitutional:      Appearance: Normal appearance.  HENT:     Head: Normocephalic.     Right Ear: Tympanic membrane normal.     Left Ear: Tympanic membrane normal.     Nose: Nose normal.  Abdominal:     Tenderness: There is left CVA tenderness.  Neurological:     Mental Status: She is alert.      Prenatal labs: ABO, Rh:   Antibody:   Rubella:   RPR:    HBsAg:    HIV:    GBS:     Assessment/Plan: IUP at 15 weeks Probable pyelonephritis Admit for observation Waiting on labs to be reported Cbc/cmet and urine culture  Start iv ancef   Cyril Mourning 06/08/2022, 3:38 PM

## 2022-06-08 NOTE — MAU Note (Signed)
.  April Osborn is a 32 y.o. at 56w1dhere in MAU reporting: lower abdominal cramping that began yesterday that radiates into lower back.  Denies VB.  Reports took Flexeril for pain, no relief noted. LMP: NA Onset of complaint: yesterday Pain score: 8 Vitals:   06/08/22 1247  BP: 130/70  Pulse: 93  Resp: 17  Temp: 98.4 F (36.9 C)  SpO2: 99%     FHT:150 bpm Lab orders placed from triage:   UA

## 2022-06-08 NOTE — Plan of Care (Signed)
  Problem: Education: Goal: Knowledge of General Education information will improve Description: Including pain rating scale, medication(s)/side effects and non-pharmacologic comfort measures Outcome: Completed/Met   

## 2022-06-09 DIAGNOSIS — O2302 Infections of kidney in pregnancy, second trimester: Secondary | ICD-10-CM | POA: Diagnosis not present

## 2022-06-09 NOTE — Progress Notes (Signed)
HD # 2   S:  patient reports feeling slightly better. Flank pain is improving.  O:  BP 111/60 (BP Location: Right Arm)   Pulse 81   Temp 98.2 F (36.8 C) (Oral)   Resp 17   Ht '5\' 8"'$  (1.727 m)   Wt 98 kg   LMP 02/05/2022   SpO2 99%   BMI 32.84 kg/m  Results for orders placed or performed during the hospital encounter of 06/08/22 (from the past 24 hour(s))  Urinalysis, Routine w reflex microscopic -Urine, Clean Catch     Status: Abnormal   Collection Time: 06/08/22 12:58 PM  Result Value Ref Range   Color, Urine YELLOW YELLOW   APPearance CLOUDY (A) CLEAR   Specific Gravity, Urine 1.016 1.005 - 1.030   pH 7.0 5.0 - 8.0   Glucose, UA NEGATIVE NEGATIVE mg/dL   Hgb urine dipstick NEGATIVE NEGATIVE   Bilirubin Urine NEGATIVE NEGATIVE   Ketones, ur NEGATIVE NEGATIVE mg/dL   Protein, ur 100 (A) NEGATIVE mg/dL   Nitrite POSITIVE (A) NEGATIVE   Leukocytes,Ua LARGE (A) NEGATIVE   RBC / HPF 6-10 0 - 5 RBC/hpf   WBC, UA >50 0 - 5 WBC/hpf   Bacteria, UA FEW (A) NONE SEEN   Squamous Epithelial / HPF 0-5 0 - 5 /HPF  CBC with Differential/Platelet     Status: Abnormal   Collection Time: 06/08/22  1:29 PM  Result Value Ref Range   WBC 11.9 (H) 4.0 - 10.5 K/uL   RBC 4.04 3.87 - 5.11 MIL/uL   Hemoglobin 12.4 12.0 - 15.0 g/dL   HCT 36.7 36.0 - 46.0 %   MCV 90.8 80.0 - 100.0 fL   MCH 30.7 26.0 - 34.0 pg   MCHC 33.8 30.0 - 36.0 g/dL   RDW 12.7 11.5 - 15.5 %   Platelets 233 150 - 400 K/uL   nRBC 0.0 0.0 - 0.2 %   Neutrophils Relative % 79 %   Neutro Abs 9.4 (H) 1.7 - 7.7 K/uL   Lymphocytes Relative 13 %   Lymphs Abs 1.5 0.7 - 4.0 K/uL   Monocytes Relative 7 %   Monocytes Absolute 0.8 0.1 - 1.0 K/uL   Eosinophils Relative 1 %   Eosinophils Absolute 0.1 0.0 - 0.5 K/uL   Basophils Relative 0 %   Basophils Absolute 0.0 0.0 - 0.1 K/uL   Immature Granulocytes 0 %   Abs Immature Granulocytes 0.05 0.00 - 0.07 K/uL  Comprehensive metabolic panel     Status: Abnormal   Collection Time:  06/08/22  1:29 PM  Result Value Ref Range   Sodium 136 135 - 145 mmol/L   Potassium 4.0 3.5 - 5.1 mmol/L   Chloride 104 98 - 111 mmol/L   CO2 23 22 - 32 mmol/L   Glucose, Bld 85 70 - 99 mg/dL   BUN 7 6 - 20 mg/dL   Creatinine, Ser 0.64 0.44 - 1.00 mg/dL   Calcium 8.8 (L) 8.9 - 10.3 mg/dL   Total Protein 6.3 (L) 6.5 - 8.1 g/dL   Albumin 2.8 (L) 3.5 - 5.0 g/dL   AST 15 15 - 41 U/L   ALT 13 0 - 44 U/L   Alkaline Phosphatase 39 38 - 126 U/L   Total Bilirubin 0.4 0.3 - 1.2 mg/dL   GFR, Estimated >60 >60 mL/min   Anion gap 9 5 - 15  Type and screen [place]     Status: None   Collection Time: 06/08/22  4:12 PM  Result Value Ref Range   ABO/RH(D) A POS    Antibody Screen NEG    Sample Expiration      06/11/2022,2359 Performed at [facility], [place] [address] [place], [place] [address]    Urine culture is pending  Mild R CVAT   IMPRESSION: IUP at 15 w 2 days Pyelonephritis   PLAN: Waiting on Urine culture Continue Rocephin Oxycodone prn

## 2022-06-10 DIAGNOSIS — O2302 Infections of kidney in pregnancy, second trimester: Secondary | ICD-10-CM | POA: Diagnosis not present

## 2022-06-10 LAB — CULTURE, OB URINE: Culture: >=100000 — AB

## 2022-06-10 MED ORDER — NITROFURANTOIN MACROCRYSTAL 100 MG PO CAPS: 100.0000 mg | ORAL_CAPSULE | Freq: Every day | ORAL | 6 refills | Status: DC

## 2022-06-10 MED ORDER — CEPHALEXIN 500 MG PO CAPS: 500.0000 mg | ORAL_CAPSULE | Freq: Four times a day (QID) | ORAL | 0 refills | Status: DC

## 2022-06-10 NOTE — Discharge Summary (Signed)
Admission Diagnosis: IUP at 15 weeks Pyelonephritis  Discharge Diagnosis: Same  Hospital Course: 32 year old female at 15 weeks admitted with flank pain and dysuria. She was placed on Rocephin. By HD # 3, she was feeling much better and had remained afebrile throughout her stay.   Her urine culture was positive for E coli sensitive to ceftriaxone.  BP (!) 109/56 (BP Location: Right Arm)   Pulse 77   Temp 98.1 F (36.7 C) (Oral)   Resp 16   Ht '5\' 8"'$  (1.727 m)   Wt 98.8 kg   LMP 02/05/2022   SpO2 100%   BMI 33.12 kg/m  No results found for this or any previous visit (from the past 24 hour(s)). Abdomen is soft and non tender  No CVA T today  Patient discharged home in good condition Rx cephalexin for 7 more days then rx macrodantin at bedtime for suppression for remainder of pregnancy  Follow up next scheduled ov

## 2022-06-22 ENCOUNTER — Telehealth: Payer: Federal, State, Local not specified - PPO | Admitting: Family Medicine

## 2022-06-22 DIAGNOSIS — B309 Viral conjunctivitis, unspecified: Secondary | ICD-10-CM | POA: Diagnosis not present

## 2022-06-22 NOTE — Progress Notes (Signed)
E-Visit for April Osborn   We are sorry that you are not feeling well.  Here is how we plan to help!  Based on what you have shared with me it looks like you have conjunctivitis.  Conjunctivitis is a common inflammatory or infectious condition of the eye that is often referred to as "pink eye".  In most cases it is contagious (viral or bacterial). However, not all conjunctivitis requires antibiotics (ex. Allergic).  We have made appropriate suggestions for you based upon your presentation. I think you are more viral.  I would use warm compresses and re wetting drops as needed which you can get over the counter.  Pink eye can be highly contagious.  It is typically spread through direct contact with secretions, or contaminated objects or surfaces that one may have touched.  Strict handwashing is suggested with soap and water is urged.  If not available, use alcohol based had sanitizer.  Avoid unnecessary touching of the eye.  If you wear contact lenses, you will need to refrain from wearing them until you see no white discharge from the eye for at least 24 hours after being on medication.  You should see symptom improvement in 1-2 days after starting the medication regimen.  Call us if symptoms are not improved in 1-2 days.  Home Care: Wash your hands often! Do not wear your contacts until you complete your treatment plan. Avoid sharing towels, bed linen, personal items with a person who has pink eye. See attention for anyone in your home with similar symptoms.  Get Help Right Away If: Your symptoms do not improve. You develop blurred or loss of vision. Your symptoms worsen (increased discharge, pain or redness)   Thank you for choosing an e-visit.  Your e-visit answers were reviewed by a board certified advanced clinical practitioner to complete your personal care plan. Depending upon the condition, your plan could have included both over the counter or prescription medications.  Please review  your pharmacy choice. Make sure the pharmacy is open so you can pick up prescription now. If there is a problem, you may contact your provider through [employer] and have the prescription routed to another pharmacy.  Your safety is important to [place]. If you have drug allergies check your prescription carefully.   For the next 24 hours you can use MyChart to ask questions about today's visit, request a non-urgent call back, or ask for a work or school excuse. You will get an email in the next two days asking about your experience. I hope that your e-visit has been valuable and will speed your recovery.  I provided 5 minutes of non face-to-face time during this encounter for chart review, medication and order placement, as well as and documentation.

## 2022-06-30 ENCOUNTER — Encounter (HOSPITAL_COMMUNITY): Payer: Self-pay | Admitting: Obstetrics and Gynecology

## 2022-06-30 ENCOUNTER — Observation Stay (HOSPITAL_COMMUNITY)
Admission: AD | Admit: 2022-06-30 | Discharge: 2022-07-01 | Disposition: A | Discharge status: Home / Self Care | Payer: Federal, State, Local not specified - PPO | Attending: Obstetrics and Gynecology | Admitting: Obstetrics and Gynecology

## 2022-06-30 ENCOUNTER — Inpatient Hospital Stay (HOSPITAL_COMMUNITY): Payer: Federal, State, Local not specified - PPO

## 2022-06-30 ENCOUNTER — Other Ambulatory Visit: Payer: Self-pay

## 2022-06-30 DIAGNOSIS — Z3A18 18 weeks gestation of pregnancy: Secondary | ICD-10-CM | POA: Diagnosis not present

## 2022-06-30 DIAGNOSIS — O2302 Infections of kidney in pregnancy, second trimester: Secondary | ICD-10-CM | POA: Diagnosis not present

## 2022-06-30 DIAGNOSIS — N10 Acute tubulo-interstitial nephritis: Secondary | ICD-10-CM | POA: Diagnosis not present

## 2022-06-30 DIAGNOSIS — R10823 Right lower quadrant rebound abdominal tenderness: Secondary | ICD-10-CM | POA: Diagnosis present

## 2022-06-30 DIAGNOSIS — N12 Tubulo-interstitial nephritis, not specified as acute or chronic: Secondary | ICD-10-CM | POA: Diagnosis present

## 2022-06-30 LAB — COMPREHENSIVE METABOLIC PANEL
ALT: 14 U/L (ref 0–44)
AST: 18 U/L (ref 15–41)
Albumin: 2.9 g/dL — ABNORMAL LOW (ref 3.5–5.0)
Alkaline Phosphatase: 37 U/L — ABNORMAL LOW (ref 38–126)
Anion gap: 7 (ref 5–15)
BUN: 9 mg/dL (ref 6–20)
CO2: 22 mmol/L (ref 22–32)
Calcium: 8.5 mg/dL — ABNORMAL LOW (ref 8.9–10.3)
Chloride: 105 mmol/L (ref 98–111)
Creatinine, Ser: 0.73 mg/dL (ref 0.44–1.00)
GFR, Estimated: >60 mL/min (ref >60)
Glucose, Bld: 104 mg/dL — ABNORMAL HIGH (ref 70–99)
Potassium: 3.6 mmol/L (ref 3.5–5.1)
Sodium: 134 mmol/L — ABNORMAL LOW (ref 135–145)
Total Bilirubin: 0.3 mg/dL (ref 0.3–1.2)
Total Protein: 6.2 g/dL — ABNORMAL LOW (ref 6.5–8.1)

## 2022-06-30 LAB — URINALYSIS, ROUTINE W REFLEX MICROSCOPIC
Bilirubin Urine: NEGATIVE
Glucose, UA: NEGATIVE mg/dL
Ketones, ur: NEGATIVE mg/dL
Nitrite: NEGATIVE
Protein, ur: >=300 mg/dL — AB
Specific Gravity, Urine: 1.015 (ref 1.005–1.030)
WBC, UA: >50 WBC/hpf (ref 0–5)
pH: 5 (ref 5.0–8.0)

## 2022-06-30 LAB — CBC WITH DIFFERENTIAL/PLATELET
Abs Immature Granulocytes: 0.04 10*3/uL (ref 0.00–0.07)
Basophils Absolute: 0 10*3/uL (ref 0.0–0.1)
Basophils Relative: 0 %
Eosinophils Absolute: 0.1 10*3/uL (ref 0.0–0.5)
Eosinophils Relative: 1 %
HCT: 37.4 % (ref 36.0–46.0)
Hemoglobin: 12.6 g/dL (ref 12.0–15.0)
Immature Granulocytes: 0 %
Lymphocytes Relative: 12 %
Lymphs Abs: 1.6 10*3/uL (ref 0.7–4.0)
MCH: 30.9 pg (ref 26.0–34.0)
MCHC: 33.7 g/dL (ref 30.0–36.0)
MCV: 91.7 fL (ref 80.0–100.0)
Monocytes Absolute: 0.8 10*3/uL (ref 0.1–1.0)
Monocytes Relative: 6 %
Neutro Abs: 10.6 10*3/uL — ABNORMAL HIGH (ref 1.7–7.7)
Neutrophils Relative %: 81 %
Platelets: 215 10*3/uL (ref 150–400)
RBC: 4.08 MIL/uL (ref 3.87–5.11)
RDW: 12.8 % (ref 11.5–15.5)
WBC: 13.1 10*3/uL — ABNORMAL HIGH (ref 4.0–10.5)
nRBC: 0 % (ref 0.0–0.2)

## 2022-06-30 LAB — TYPE AND SCREEN
ABO/RH(D): A POS
Antibody Screen: NEGATIVE

## 2022-06-30 MED ORDER — PRENATAL MULTIVITAMIN CH
1.0000 | ORAL_TABLET | Freq: Every day | ORAL | Status: DC
  Administered 2022-06-30: 1 via ORAL
  Filled 2022-06-30: qty 1

## 2022-06-30 MED ORDER — IBUPROFEN 600 MG PO TABS
600.0000 mg | ORAL_TABLET | Freq: Once | ORAL | Status: AC
  Administered 2022-06-30: 600 mg via ORAL
  Filled 2022-06-30: qty 1

## 2022-06-30 MED ORDER — LACTATED RINGERS IV SOLN
125.0000 mL/h | INTRAVENOUS | Status: AC
  Administered 2022-06-30: 125 mL/h via INTRAVENOUS

## 2022-06-30 MED ORDER — DOCUSATE SODIUM 100 MG PO CAPS
100.0000 mg | ORAL_CAPSULE | Freq: Every day | ORAL | Status: DC
  Administered 2022-07-01: 100 mg via ORAL
  Filled 2022-06-30: qty 1

## 2022-06-30 MED ORDER — ZOLPIDEM TARTRATE 5 MG PO TABS: 5.0000 mg | ORAL_TABLET | Freq: Every evening | ORAL | Status: DC | PRN

## 2022-06-30 MED ORDER — HYDROCORTISONE 1 % EX CREA: TOPICAL_CREAM | CUTANEOUS | Status: DC | PRN

## 2022-06-30 MED ORDER — HYDROMORPHONE HCL 1 MG/ML IJ SOLN
1.0000 mg | INTRAMUSCULAR | Status: DC | PRN
  Administered 2022-06-30 – 2022-07-01 (×3): 1 mg via INTRAVENOUS
  Filled 2022-06-30 (×4): qty 1

## 2022-06-30 MED ORDER — ACETAMINOPHEN 500 MG PO TABS
1000.0000 mg | ORAL_TABLET | Freq: Four times a day (QID) | ORAL | Status: DC
  Administered 2022-06-30 – 2022-07-01 (×4): 1000 mg via ORAL
  Filled 2022-06-30 (×4): qty 2

## 2022-06-30 MED ORDER — ONDANSETRON HCL 4 MG/2ML IJ SOLN: 4.0000 mg | Freq: Four times a day (QID) | INTRAMUSCULAR | Status: DC | PRN

## 2022-06-30 MED ORDER — CYCLOBENZAPRINE HCL 10 MG PO TABS: 10.0000 mg | ORAL_TABLET | Freq: Three times a day (TID) | ORAL | Status: DC | PRN

## 2022-06-30 MED ORDER — CYCLOBENZAPRINE HCL 5 MG PO TABS
10.0000 mg | ORAL_TABLET | Freq: Once | ORAL | Status: AC
  Administered 2022-06-30: 10 mg via ORAL
  Filled 2022-06-30: qty 2

## 2022-06-30 MED ORDER — SODIUM CHLORIDE 0.9 % IV SOLN
2.0000 g | INTRAVENOUS | Status: DC
  Administered 2022-06-30 – 2022-07-01 (×2): 2 g via INTRAVENOUS
  Filled 2022-06-30 (×2): qty 20

## 2022-06-30 MED ORDER — OXYCODONE HCL 5 MG PO TABS
10.0000 mg | ORAL_TABLET | Freq: Four times a day (QID) | ORAL | Status: DC | PRN
  Administered 2022-06-30: 10 mg via ORAL
  Filled 2022-06-30: qty 2

## 2022-06-30 MED ORDER — CALCIUM CARBONATE ANTACID 500 MG PO CHEW: 2.0000 | CHEWABLE_TABLET | ORAL | Status: DC | PRN

## 2022-06-30 MED ORDER — LACTATED RINGERS IV BOLUS
1000.0000 mL | Freq: Once | INTRAVENOUS | Status: AC
  Administered 2022-06-30: 1000 mL via INTRAVENOUS

## 2022-06-30 MED ORDER — ONDANSETRON 4 MG PO TBDP: 4.0000 mg | ORAL_TABLET | Freq: Four times a day (QID) | ORAL | Status: DC | PRN

## 2022-06-30 MED ORDER — DIPHENHYDRAMINE HCL 25 MG PO CAPS
25.0000 mg | ORAL_CAPSULE | Freq: Four times a day (QID) | ORAL | Status: DC | PRN
  Administered 2022-06-30: 25 mg via ORAL
  Filled 2022-06-30: qty 1

## 2022-06-30 NOTE — H&P (Addendum)
April Osborn is a 32 y.o. G 3 P 2112 at 18 w 2 day presents with flank pain since last night. She was admitted for pyelo 3/1 at 24 wga and UCX grew out E. Coli. She was treated with ancef and finished a PO course of cephalexin. She also was on daily macrobid and has been compliant. She now has dysuria, urinary frequency, and flank pain of new onset.  Pain has worsened.  No fevers.  She has received flexeril and ibuprofen with improvement but not resolution of pain. Her urine had leukocytes and some blood. Her WBC has slightly elevated to 13.   OB History     Gravida  3   Para  2   Term  1   Preterm  1   AB      Living  2      SAB      IAB      Ectopic      Multiple  0   Live Births  2          Past Medical History:  Diagnosis Date   Medical history non-contributory    Past Surgical History:  Procedure Laterality Date   NO PAST SURGERIES     Family History: family history includes Cancer in her maternal grandmother. Social History:  reports that she has never smoked. She has never used smokeless tobacco. She reports that she does not drink alcohol and does not use drugs.     Maternal Diabetes: No Genetic Screening: Normal Maternal Ultrasounds/Referrals: Normal Fetal Ultrasounds or other Referrals:  None Maternal Substance Abuse:  No Significant Maternal Medications:  None Significant Maternal Lab Results:  None Number of Prenatal Visits:greater than 3 verified prenatal visits Other Comments:  None  Review of Systems  All other systems reviewed and are negative.  History   Blood pressure 115/72, pulse 80, temperature 98.6 F (37 C), temperature source Oral, resp. rate 18, height 5\' 8"  (1.727 m), weight 98.3 kg, last menstrual period 02/05/2022, SpO2 100 %, unknown if currently breastfeeding. Exam Physical Exam Vitals and nursing note reviewed. Exam conducted with a chaperone present.  Constitutional:      Appearance: Normal appearance.  HENT:      Head: Normocephalic.     Right Ear: Tympanic membrane normal.     Left Ear: Tympanic membrane normal.     Nose: Nose normal.  Abdominal:     Tenderness: There is right CVA tenderness.  Neurological:     Mental Status: She is alert.     Prenatal labs: ABO, Rh: --/--/A POS (03/01 1612) Antibody: NEG (03/01 1612)   Assessment/Plan: 32 yo [id number] presenting @ 18.2 weeks with probable pyelonephritis Admit for observation and IV ancef UCX sent, will f/u results Pain control FHTs daily   Tyson Dense 06/30/2022, 12:08 PM

## 2022-06-30 NOTE — MAU Note (Signed)
April Osborn is a 32 y.o. at [redacted]w[redacted]d here in MAU reporting: she has lower abdominal cramping, back pain, and "kidney pain" that began overnight.  Reports took Flexeril yesterday, but no relief.  Denies VB.  Reports has burning and frequency with urination. LMP: NA Onset of complaint: last night Pain score: 9 Vitals:   06/30/22 0821  BP: 125/80  Pulse: 88  Resp: 18  Temp: 98.1 F (36.7 C)  SpO2: 98%     FHT:148 bpm Lab orders placed from triage:   UA

## 2022-06-30 NOTE — MAU Provider Note (Signed)
History     CSN: [id number]  Arrival date and time: 06/30/22 0754   Event Date/Time   First Provider Initiated Contact with Patient 06/30/22 [phone]      Chief Complaint  Patient presents with   Back Pain   Cramps   HPI  [name] is a 32 y.o. female G3P1102 @ [redacted]w[redacted]d here in MAU with complaints of right sided flank pain. The pain started on 3/1 and she was seen in MAU and admitted to [facility] for pyelonephritis. She completed her antibiotics and is taking her suppression daily as prescribed.  She reports 10/10 flank pain. No chills, no fever. She also reports dysuria.   OB History     Gravida  3   Para  2   Term  1   Preterm  1   AB      Living  2      SAB      IAB      Ectopic      Multiple  0   Live Births  2           Past Medical History:  Diagnosis Date   Medical history non-contributory     Past Surgical History:  Procedure Laterality Date   NO PAST SURGERIES      Family History  Problem Relation Age of Onset   Cancer Maternal Grandmother     Social History   Tobacco Use   Smoking status: Never   Smokeless tobacco: Never  Vaping Use   Vaping Use: Never used  Substance Use Topics   Alcohol use: Never   Drug use: Never    Allergies: No Known Allergies  Medications Prior to Admission  Medication Sig Dispense Refill Last Dose   cephALEXin (KEFLEX) 500 MG capsule Take 1 capsule (500 mg total) by mouth 4 (four) times daily. 28 capsule 0 06/29/2022   cyclobenzaprine (FLEXERIL) 10 MG tablet Take 0.5-1 tablets (5-10 mg total) by mouth 3 (three) times daily as needed. 30 tablet 0 06/29/2022   nitrofurantoin (MACRODANTIN) 100 MG capsule Take 1 capsule (100 mg total) by mouth at bedtime. 30 capsule 6 06/29/2022   Prenatal Vit-Fe Fumarate-FA (MULTIVITAMIN-PRENATAL) 27-0.8 MG TABS tablet Take 1 tablet by mouth daily at 12 noon.   06/29/2022   clobetasol (TEMOVATE) 0.05 % external solution Apply 1 Application topically 2 (two) times daily.  50 mL 0    famotidine (PEPCID) 20 MG tablet Take 1 tablet (20 mg total) by mouth 2 (two) times daily. 30 tablet 0 More than a month   fluticasone (FLONASE) 50 MCG/ACT nasal spray Place 2 sprays into both nostrils daily. (Patient not taking: Reported on 06/08/2022) 16 g 0    hydrocortisone cream 0.5 % Apply 1 Application topically 2 (two) times daily. (Patient not taking: Reported on 06/08/2022) 30 g 0    levocetirizine (XYZAL) 5 MG tablet Take 1 tablet (5 mg total) by mouth every evening. (Patient not taking: Reported on 06/08/2022) 30 tablet 0    metoCLOPramide (REGLAN) 10 MG tablet Take 1 tablet (10 mg total) by mouth every 6 (six) hours. 30 tablet 0 More than a month   ondansetron (ZOFRAN) 4 MG tablet Take 1 tablet (4 mg total) by mouth every 8 (eight) hours as needed for nausea or vomiting. 20 tablet 0 More than a month   promethazine (PHENERGAN) 25 MG tablet Take 1 tablet (25 mg total) by mouth every 6 (six) hours as needed for nausea or vomiting. 30 tablet 0  More than a month   scopolamine (TRANSDERM-SCOP) 1 MG/3DAYS Place 1 patch (1.5 mg total) onto the skin every 3 (three) days. (Patient not taking: Reported on 06/08/2022) 10 patch 12    Results for orders placed or performed during the hospital encounter of 06/30/22 (from the past 48 hour(s))  Urinalysis, Routine w reflex microscopic -Urine, Clean Catch     Status: Abnormal   Collection Time: 06/30/22  8:33 AM  Result Value Ref Range   Color, Urine AMBER (A) YELLOW    Comment: BIOCHEMICALS MAY BE AFFECTED BY COLOR   APPearance CLOUDY (A) CLEAR   Specific Gravity, Urine 1.015 1.005 - 1.030   pH 5.0 5.0 - 8.0   Glucose, UA NEGATIVE NEGATIVE mg/dL   Hgb urine dipstick SMALL (A) NEGATIVE   Bilirubin Urine NEGATIVE NEGATIVE   Ketones, ur NEGATIVE NEGATIVE mg/dL   Protein, ur >=300 (A) NEGATIVE mg/dL   Nitrite NEGATIVE NEGATIVE   Leukocytes,Ua MODERATE (A) NEGATIVE   RBC / HPF 6-10 0 - 5 RBC/hpf   WBC, UA >50 0 - 5 WBC/hpf   Bacteria, UA FEW  (A) NONE SEEN   Squamous Epithelial / HPF 11-20 0 - 5 /HPF   WBC Clumps PRESENT    Mucus PRESENT    Non Squamous Epithelial 0-5 (A) NONE SEEN    Comment: Performed at [facility], [address], [place], [place] [address]  CBC with Differential/Platelet     Status: Abnormal   Collection Time: 06/30/22  9:11 AM  Result Value Ref Range   WBC 13.1 (H) 4.0 - 10.5 K/uL   RBC 4.08 3.87 - 5.11 MIL/uL   Hemoglobin 12.6 12.0 - 15.0 g/dL   HCT 37.4 36.0 - 46.0 %   MCV 91.7 80.0 - 100.0 fL   MCH 30.9 26.0 - 34.0 pg   MCHC 33.7 30.0 - 36.0 g/dL   RDW 12.8 11.5 - 15.5 %   Platelets 215 150 - 400 K/uL   nRBC 0.0 0.0 - 0.2 %   Neutrophils Relative % 81 %   Neutro Abs 10.6 (H) 1.7 - 7.7 K/uL   Lymphocytes Relative 12 %   Lymphs Abs 1.6 0.7 - 4.0 K/uL   Monocytes Relative 6 %   Monocytes Absolute 0.8 0.1 - 1.0 K/uL   Eosinophils Relative 1 %   Eosinophils Absolute 0.1 0.0 - 0.5 K/uL   Basophils Relative 0 %   Basophils Absolute 0.0 0.0 - 0.1 K/uL   Immature Granulocytes 0 %   Abs Immature Granulocytes 0.04 0.00 - 0.07 K/uL    Comment: Performed at [facility] [address], [place], [place] [address]  Comprehensive metabolic panel     Status: Abnormal   Collection Time: 06/30/22  9:11 AM  Result Value Ref Range   Sodium 134 (L) 135 - 145 mmol/L   Potassium 3.6 3.5 - 5.1 mmol/L   Chloride 105 98 - 111 mmol/L   CO2 22 22 - 32 mmol/L   Glucose, Bld 104 (H) 70 - 99 mg/dL    Comment: Glucose reference range applies only to samples taken after fasting for at least 8 hours.   BUN 9 6 - 20 mg/dL   Creatinine, Ser 0.73 0.44 - 1.00 mg/dL   Calcium 8.5 (L) 8.9 - 10.3 mg/dL   Total Protein 6.2 (L) 6.5 - 8.1 g/dL   Albumin 2.9 (L) 3.5 - 5.0 g/dL   AST 18 15 - 41 U/L   ALT 14 0 -  44 U/L   Alkaline Phosphatase 37 (L) 38 - 126 U/L   Total Bilirubin 0.3 0.3 - 1.2 mg/dL   GFR, Estimated >60 >60 mL/min    Comment: (NOTE) Calculated using the CKD-EPI Creatinine Equation (2021)     Anion gap 7 5 - 15    Comment: Performed at [facility] [address] [place], [place] [address]    US RENAL  Result Date: 06/30/2022 CLINICAL DATA:  [id number] Right flank pain [id number] EXAM: RENAL / URINARY TRACT ULTRASOUND COMPLETE COMPARISON:  None Available. FINDINGS: The right kidney measured 12.1 cm and the left kidney measured 10.7 cm. The kidneys demonstrate normal echogenicity. No renal parenchymal lesions are identified. No shadowing stones are seen. Mild hydronephrosis on the right. The urinary bladder appeared unremarkable. Volumes were not measured. IMPRESSION: Mild hydronephrosis on the right. Otherwise unremarkable examination of the kidneys and bladder. Electronically Signed   By: Sammie Bench M.D.   On: 06/30/2022 10:22     Review of Systems  Constitutional:  Positive for chills. Negative for fever.  Gastrointestinal:  Negative for abdominal pain.  Genitourinary:  Positive for dysuria, flank pain and frequency. Negative for vaginal bleeding.   Physical Exam   Blood pressure 119/70, pulse 81, temperature 98.1 F (36.7 C), temperature source Oral, resp. rate 18, height 5\' 8"  (1.727 m), weight 98.3 kg, last menstrual period 02/05/2022, SpO2 98 %, unknown if currently breastfeeding.  Physical Exam  MAU Course  Procedures  MDM  CBC with diff CMP LR bolus with flexeril and ibuprofen Renal US.  Pain now 7/10 following medication. WBC's now trending up. Reviewed patient with Dr. Harolyn Rutherford and Dr. Royston Sinner, both agree to admit for IV antibiotics. The patient was notified of the plan of care.   Assessment and Plan    A:  1. Pyelonephritis affecting pregnancy in second trimester   2. [redacted] weeks gestation of pregnancy      P:  Admit to OBS Urine culture pending Dr. Royston Sinner to enter admission orders Patient is aware of plan.  Lezlie Lye, NP 06/30/2022 11:41 AM

## 2022-07-01 DIAGNOSIS — N10 Acute tubulo-interstitial nephritis: Secondary | ICD-10-CM | POA: Diagnosis not present

## 2022-07-01 LAB — CBC
HCT: 32.8 % — ABNORMAL LOW (ref 36.0–46.0)
Hemoglobin: 11 g/dL — ABNORMAL LOW (ref 12.0–15.0)
MCH: 30.6 pg (ref 26.0–34.0)
MCHC: 33.5 g/dL (ref 30.0–36.0)
MCV: 91.4 fL (ref 80.0–100.0)
Platelets: 197 10*3/uL (ref 150–400)
RBC: 3.59 MIL/uL — ABNORMAL LOW (ref 3.87–5.11)
RDW: 12.8 % (ref 11.5–15.5)
WBC: 8.6 10*3/uL (ref 4.0–10.5)
nRBC: 0 % (ref 0.0–0.2)

## 2022-07-01 LAB — CULTURE, OB URINE: Culture: <10000 — AB

## 2022-07-01 MED ORDER — CEPHALEXIN 500 MG PO CAPS: 500.0000 mg | ORAL_CAPSULE | Freq: Four times a day (QID) | ORAL | 0 refills | Status: DC

## 2022-07-01 NOTE — Plan of Care (Signed)
  Problem: Education: Goal: Knowledge of disease or condition will improve 07/01/2022 1121 by Sylvan Cheese, RN Outcome: Completed/Met 07/01/2022 0850 by Sylvan Cheese, RN Outcome: Progressing Goal: Knowledge of the prescribed therapeutic regimen will improve 07/01/2022 1121 by Sylvan Cheese, RN Outcome: Completed/Met 07/01/2022 0850 by Sylvan Cheese, RN Outcome: Progressing Goal: Individualized Educational Video(s) 07/01/2022 1121 by Sylvan Cheese, RN Outcome: Completed/Met 07/01/2022 0850 by Sylvan Cheese, RN Outcome: Progressing   Problem: Clinical Measurements: Goal: Complications related to the disease process, condition or treatment will be avoided or minimized 07/01/2022 1121 by Sylvan Cheese, RN Outcome: Completed/Met 07/01/2022 0850 by Sylvan Cheese, RN Outcome: Progressing   Problem: Education: Goal: Knowledge of General Education information will improve Description: Including pain rating scale, medication(s)/side effects and non-pharmacologic comfort measures 07/01/2022 1121 by Sylvan Cheese, RN Outcome: Completed/Met 07/01/2022 0850 by Sylvan Cheese, RN Outcome: Progressing   Problem: Health Behavior/Discharge Planning: Goal: Ability to manage health-related needs will improve 07/01/2022 1121 by Sylvan Cheese, RN Outcome: Completed/Met 07/01/2022 0850 by Sylvan Cheese, RN Outcome: Progressing   Problem: Clinical Measurements: Goal: Ability to maintain clinical measurements within normal limits will improve 07/01/2022 1121 by Sylvan Cheese, RN Outcome: Completed/Met 07/01/2022 0850 by Sylvan Cheese, RN Outcome: Progressing Goal: Will remain free from infection 07/01/2022 1121 by Sylvan Cheese, RN Outcome: Completed/Met 07/01/2022 0850 by Sylvan Cheese, RN Outcome: Progressing Goal: Diagnostic test results will improve 07/01/2022 1121 by Sylvan Cheese, RN Outcome: Completed/Met 07/01/2022 0850 by Sylvan Cheese, RN Outcome: Progressing Goal: Respiratory complications  will improve 07/01/2022 1121 by Sylvan Cheese, RN Outcome: Completed/Met 07/01/2022 0850 by Sylvan Cheese, RN Outcome: Progressing Goal: Cardiovascular complication will be avoided 07/01/2022 1121 by Sylvan Cheese, RN Outcome: Completed/Met 07/01/2022 0850 by Sylvan Cheese, RN Outcome: Progressing   Problem: Activity: Goal: Risk for activity intolerance will decrease 07/01/2022 1121 by Sylvan Cheese, RN Outcome: Completed/Met 07/01/2022 0850 by Sylvan Cheese, RN Outcome: Progressing   Problem: Nutrition: Goal: Adequate nutrition will be maintained 07/01/2022 1121 by Sylvan Cheese, RN Outcome: Completed/Met 07/01/2022 0850 by Sylvan Cheese, RN Outcome: Progressing   Problem: Coping: Goal: Level of anxiety will decrease 07/01/2022 1121 by Sylvan Cheese, RN Outcome: Completed/Met 07/01/2022 0850 by Sylvan Cheese, RN Outcome: Progressing   Problem: Elimination: Goal: Will not experience complications related to bowel motility 07/01/2022 1121 by Sylvan Cheese, RN Outcome: Completed/Met 07/01/2022 0850 by Sylvan Cheese, RN Outcome: Progressing Goal: Will not experience complications related to urinary retention 07/01/2022 1121 by Sylvan Cheese, RN Outcome: Completed/Met 07/01/2022 0850 by Sylvan Cheese, RN Outcome: Progressing   Problem: Pain Managment: Goal: General experience of comfort will improve 07/01/2022 1121 by Sylvan Cheese, RN Outcome: Completed/Met 07/01/2022 0850 by Sylvan Cheese, RN Outcome: Progressing   Problem: Safety: Goal: Ability to remain free from injury will improve 07/01/2022 1121 by Sylvan Cheese, RN Outcome: Completed/Met 07/01/2022 0850 by Sylvan Cheese, RN Outcome: Progressing   Problem: Skin Integrity: Goal: Risk for impaired skin integrity will decrease 07/01/2022 1121 by Sylvan Cheese, RN Outcome: Completed/Met 07/01/2022 0850 by Sylvan Cheese, RN Outcome: Progressing

## 2022-07-01 NOTE — Plan of Care (Signed)
  Problem: Education: Goal: Knowledge of disease or condition will improve Outcome: Progressing Goal: Knowledge of the prescribed therapeutic regimen will improve Outcome: Progressing Goal: Individualized Educational Video(s) Outcome: Progressing   Problem: Clinical Measurements: Goal: Complications related to the disease process, condition or treatment will be avoided or minimized Outcome: Progressing   Problem: Education: Goal: Knowledge of General Education information will improve Description: Including pain rating scale, medication(s)/side effects and non-pharmacologic comfort measures Outcome: Progressing   Problem: Health Behavior/Discharge Planning: Goal: Ability to manage health-related needs will improve Outcome: Progressing   Problem: Clinical Measurements: Goal: Ability to maintain clinical measurements within normal limits will improve Outcome: Progressing Goal: Will remain free from infection Outcome: Progressing Goal: Diagnostic test results will improve Outcome: Progressing Goal: Respiratory complications will improve Outcome: Progressing Goal: Cardiovascular complication will be avoided Outcome: Progressing   Problem: Activity: Goal: Risk for activity intolerance will decrease Outcome: Progressing   Problem: Nutrition: Goal: Adequate nutrition will be maintained Outcome: Progressing   Problem: Coping: Goal: Level of anxiety will decrease Outcome: Progressing   Problem: Elimination: Goal: Will not experience complications related to bowel motility Outcome: Progressing Goal: Will not experience complications related to urinary retention Outcome: Progressing   Problem: Pain Managment: Goal: General experience of comfort will improve Outcome: Progressing   Problem: Safety: Goal: Ability to remain free from injury will improve Outcome: Progressing   Problem: Skin Integrity: Goal: Risk for impaired skin integrity will decrease Outcome:  Progressing   

## 2022-07-01 NOTE — Progress Notes (Signed)
S: Feeling a lot better. Back pain greatly improved. Still has bladder cramping/discomfort. Dysuria improving. Denies contractions or LOF.   O: Vitals:   07/01/22 0020 07/01/22 0506  BP: (!) 108/47 (!) 105/46  Pulse: 78 74  Resp: 17 18  Temp:  98.3 F (36.8 C)  SpO2: 100% 100%   NAD, A&O NWOB Abd soft, nondistended, gravid NO CVA TENDERNESS   CBC    Component Value Date/Time   WBC 8.6 07/01/2022 0541   RBC 3.59 (L) 07/01/2022 0541   HGB 11.0 (L) 07/01/2022 0541   HCT 32.8 (L) 07/01/2022 0541   PLT 197 07/01/2022 0541   MCV 91.4 07/01/2022 0541   MCH 30.6 07/01/2022 0541   MCHC 33.5 07/01/2022 0541   RDW 12.8 07/01/2022 0541   LYMPHSABS 1.6 06/30/2022 0911   MONOABS 0.8 06/30/2022 0911   EOSABS 0.1 06/30/2022 0911   BASOSABS 0.0 06/30/2022 0911     A/P:  32 yo G3P1102 @ 18.3 weeks with probable pyelonephritis Continue IV ancef Clinically improving by exam and WBC count UCX sent, will f/u results Pain control FHTs daily Defer prophylactic Macrobid for now as this occurred while taking Will likely keep in house until culture reports given this is second bout of pyelo   Lucillie Garfinkel MD

## 2022-07-01 NOTE — Discharge Summary (Signed)
Physician Discharge Summary  Patient ID: April Osborn MRN: [id number] DOB/AGE: 1990-06-20 32 y.o.  Admit date: 06/30/2022 Discharge date: 07/01/2022  Admission Diagnoses:  Discharge Diagnoses:  Principal Problem:   Pyelonephritis   Discharged Condition: good  Hospital Course: 32 yo G3P1102 presented at 73 wga with presumed pyelonephritis.  She was admitted for pyelo 3/1 at 14 wga and UCX grew out E. Coli. She was treated with ancef and finished a PO course of cephalexin. She also was on daily macrobid and was compliant.  Upon arrival this admission her UA was c/f infection and she had leukocytosis. Renal US reassuring overall. She had CVA tenderness. She was given 2 doses of ancef 24 hours apart and her symptoms resolved. Her UCX showed <10k growth. However, given her significant symptoms, she will be continued on an outpatient course of cephalexin. She was referred to urology to consider options for further management.   Consults: None  Significant Diagnostic Studies: labs: CBC    Component Value Date/Time   WBC 8.6 07/01/2022 0541   RBC 3.59 (L) 07/01/2022 0541   HGB 11.0 (L) 07/01/2022 0541   HCT 32.8 (L) 07/01/2022 0541   PLT 197 07/01/2022 0541   MCV 91.4 07/01/2022 0541   MCH 30.6 07/01/2022 0541   MCHC 33.5 07/01/2022 0541   RDW 12.8 07/01/2022 0541   LYMPHSABS 1.6 06/30/2022 0911   MONOABS 0.8 06/30/2022 0911   EOSABS 0.1 06/30/2022 0911   BASOSABS 0.0 06/30/2022 0911    and microbiology: urine culture: negative  Treatments: antibiotics: Ancef  Discharge Exam: Blood pressure 112/63, pulse 68, temperature 98.2 F (36.8 C), temperature source Oral, resp. rate 15, height 5\' 8"  (1.727 m), weight 98.3 kg, last menstrual period 02/05/2022, SpO2 100 %, unknown if currently breastfeeding. General appearance: alert and cooperative Back: symmetric, no curvature. ROM normal. No CVA tenderness. GI: soft, non-tender; bowel sounds normal; no masses,  no organomegaly and  gravid  Disposition: Discharge disposition: 01-Home or Self Care       Discharge Instructions     Ambulatory referral to Urology   Complete by: As directed    Discharge activity:  No Restrictions   Complete by: As directed    Discharge diet:  No restrictions   Complete by: As directed    No sexual activity restrictions   Complete by: As directed    Notify physician for a general feeling that "something is not right"   Complete by: As directed    Notify physician for increase or change in vaginal discharge   Complete by: As directed    Notify physician for intestinal cramps, with or without diarrhea, sometimes described as "gas pain"   Complete by: As directed    Notify physician for leaking of fluid   Complete by: As directed    Notify physician for low, dull backache, unrelieved by heat or Tylenol   Complete by: As directed    Notify physician for menstrual like cramps   Complete by: As directed    Notify physician for pelvic pressure   Complete by: As directed    Notify physician for uterine contractions.  These may be painless and feel like the uterus is tightening or the baby is  "balling up"   Complete by: As directed    Notify physician for vaginal bleeding   Complete by: As directed    PRETERM LABOR:  Includes any of the follwing symptoms that occur between 20 - [redacted] weeks gestation.  If these symptoms are not  stopped, preterm labor can result in preterm delivery, placing your baby at risk   Complete by: As directed       Allergies as of 07/01/2022       Reactions   Oxy Ir [oxycodone] Itching, Rash        Medication List     STOP taking these medications    clobetasol 0.05 % external solution Commonly known as: TEMOVATE   cyclobenzaprine 10 MG tablet Commonly known as: FLEXERIL   fluticasone 50 MCG/ACT nasal spray Commonly known as: FLONASE   hydrocortisone cream 0.5 %   levocetirizine 5 MG tablet Commonly known as: XYZAL   metoCLOPramide 10 MG  tablet Commonly known as: REGLAN   nitrofurantoin 100 MG capsule Commonly known as: Macrodantin   scopolamine 1 MG/3DAYS Commonly known as: TRANSDERM-SCOP       TAKE these medications    cephALEXin 500 MG capsule Commonly known as: Keflex Take 1 capsule (500 mg total) by mouth 4 (four) times daily.   famotidine 20 MG tablet Commonly known as: PEPCID Take 1 tablet (20 mg total) by mouth 2 (two) times daily.   multivitamin-prenatal 27-0.8 MG Tabs tablet Take 1 tablet by mouth daily at 12 noon.   ondansetron 4 MG tablet Commonly known as: Zofran Take 1 tablet (4 mg total) by mouth every 8 (eight) hours as needed for nausea or vomiting.   promethazine 25 MG tablet Commonly known as: PHENERGAN Take 1 tablet (25 mg total) by mouth every 6 (six) hours as needed for nausea or vomiting.         Signed: Tyson Dense 07/01/2022, 10:37 AM

## 2022-07-24 ENCOUNTER — Other Ambulatory Visit (HOSPITAL_BASED_OUTPATIENT_CLINIC_OR_DEPARTMENT_OTHER): Payer: Self-pay

## 2022-07-24 MED ORDER — COMIRNATY 30 MCG/0.3ML IM SUSY
0.3000 mL | PREFILLED_SYRINGE | INTRAMUSCULAR | 0 refills | Status: DC
  Filled 2022-07-24: qty 0.3, 1d supply, fill #0

## 2022-07-26 ENCOUNTER — Other Ambulatory Visit (HOSPITAL_BASED_OUTPATIENT_CLINIC_OR_DEPARTMENT_OTHER): Payer: Self-pay

## 2022-08-10 ENCOUNTER — Telehealth: Payer: Federal, State, Local not specified - PPO | Admitting: Family Medicine

## 2022-08-10 DIAGNOSIS — R3989 Other symptoms and signs involving the genitourinary system: Secondary | ICD-10-CM

## 2022-08-10 MED ORDER — CEPHALEXIN 500 MG PO CAPS: 500.0000 mg | ORAL_CAPSULE | Freq: Two times a day (BID) | ORAL | 0 refills | Status: AC

## 2022-08-10 NOTE — Progress Notes (Signed)

## 2022-08-17 ENCOUNTER — Telehealth: Payer: Federal, State, Local not specified - PPO | Admitting: Nurse Practitioner

## 2022-08-17 DIAGNOSIS — J302 Other seasonal allergic rhinitis: Secondary | ICD-10-CM | POA: Diagnosis not present

## 2022-08-18 MED ORDER — LORATADINE 10 MG PO TABS: 10.0000 mg | ORAL_TABLET | Freq: Every day | ORAL | 11 refills | Status: DC

## 2022-08-18 NOTE — Progress Notes (Signed)
E visit for Allergic Rhinitis We are sorry that you are not feeling well.  Here is how we plan to help!  Based on what you have shared with me it looks like you have Allergic Rhinitis.  Rhinitis is when a reaction occurs that causes nasal congestion, runny nose, sneezing, and itching.  Most types of rhinitis are caused by an inflammation and are associated with symptoms in the eyes ears or throat. There are several types of rhinitis.  The most common are acute rhinitis, which is usually caused by a viral illness, allergic or seasonal rhinitis, and nonallergic or year-round rhinitis.  Nasal allergies occur certain times of the year.  Allergic rhinitis is caused when allergens in the air trigger the release of histamine in the body.  Histamine causes itching, swelling, and fluid to build up in the fragile linings of the nasal passages, sinuses and eyelids.  An itchy nose and clear discharge are common.  I recommend the following over the counter treatments: You should take a daily dose of antihistamine Claritin has been prescribed  HOME CARE:  You can use an over-the-counter saline nasal spray as needed Avoid areas where there is heavy dust, mites, or molds Stay indoors on windy days during the pollen season Keep windows closed in home, at least in bedroom; use air conditioner. Use high-efficiency house air filter Keep windows closed in car, turn AC on re-circulate Avoid playing out with dog during pollen season  GET HELP RIGHT AWAY IF:  If your symptoms do not improve within 10 days You become short of breath You develop yellow or green discharge from your nose for over 3 days You have coughing fits  MAKE SURE YOU:  Understand these instructions Will watch your condition Will get help right away if you are not doing well or get worse  Thank you for choosing an e-visit. Your e-visit answers were reviewed by a board certified advanced clinical practitioner to complete your personal  care plan. Depending upon the condition, your plan could have included both over the counter or prescription medications. Please review your pharmacy choice. Be sure that the pharmacy you have chosen is open so that you can pick up your prescription now.  If there is a problem you may message your provider in MyChart to have the prescription routed to another pharmacy. Your safety is important to [place]. If you have drug allergies check your prescription carefully.  For the next 24 hours, you can use MyChart to ask questions about today's visit, request a non-urgent call back, or ask for a work or school excuse from your e-visit provider. You will get an email in the next two days asking about your experience. I hope that your e-visit has been valuable and will speed your recovery.

## 2022-08-18 NOTE — Progress Notes (Signed)
I have spent 5 minutes in review of e-visit questionnaire, review and updating patient chart, medical decision making and response to patient.  ° °Derricka Mertz W Elgie Landino, NP ° °  °

## 2022-08-29 LAB — OB RESULTS CONSOLE RPR: RPR: NONREACTIVE

## 2022-08-29 LAB — OB RESULTS CONSOLE HIV ANTIBODY (ROUTINE TESTING): HIV: NONREACTIVE

## 2022-09-03 ENCOUNTER — Telehealth: Payer: Federal, State, Local not specified - PPO | Admitting: Nurse Practitioner

## 2022-09-03 DIAGNOSIS — J029 Acute pharyngitis, unspecified: Secondary | ICD-10-CM | POA: Diagnosis not present

## 2022-09-03 NOTE — Progress Notes (Signed)
E-Visit for Sore Throat  We are sorry that you are not feeling well.  Here is how we plan to help!  Your symptoms indicate a likely viral infection (Pharyngitis).   Pharyngitis is inflammation in the back of the throat which can cause a sore throat, scratchiness and sometimes difficulty swallowing.   Pharyngitis is typically caused by a respiratory virus and will just run its course.  Please keep in mind that your symptoms could last up to 10 days.  For throat pain, we recommend over the counter oral pain relief medications that have been approved by your OBGYN, and topical treatments such as oral throat lozenges or sprays may be used as needed.  Avoid close contact with loved ones, especially the very young and elderly.  Remember to wash your hands thoroughly throughout the day as this is the number one way to prevent the spread of infection and wipe down door knobs and counters with disinfectant.  Typically a sore throat associated with a runny nose is from a virus and there is no need for antibiotics  Warm salt water gargles are another safe way to get relief.   After careful review of your answers, I would not recommend an antibiotic for your condition.  Antibiotics should not be used to treat conditions that we suspect are caused by viruses like the virus that causes the common cold or flu. However, some people can have Strep with atypical symptoms. You may need formal testing in clinic or office to confirm if your symptoms continue or worsen.  Providers prescribe antibiotics to treat infections caused by bacteria. Antibiotics are very powerful in treating bacterial infections when they are used properly.  To maintain their effectiveness, they should be used only when necessary.  Overuse of antibiotics has resulted in the development of super bugs that are resistant to treatment!    Home Care: Only take medications as instructed by your medical team. Do not drink alcohol while taking these  medications. A steam or ultrasonic humidifier can help congestion.  You can place a towel over your head and breathe in the steam from hot water coming from a faucet. Avoid close contacts especially the very young and the elderly. Cover your mouth when you cough or sneeze. Always remember to wash your hands.  Get Help Right Away If: You develop worsening fever or throat pain. You develop a severe head ache or visual changes. Your symptoms persist after you have completed your treatment plan.  Make sure you Understand these instructions. Will watch your condition. Will get help right away if you are not doing well or get worse.   Thank you for choosing an e-visit.  Your e-visit answers were reviewed by a board certified advanced clinical practitioner to complete your personal care plan. Depending upon the condition, your plan could have included both over the counter or prescription medications.  Please review your pharmacy choice. Make sure the pharmacy is open so you can pick up prescription now. If there is a problem, you may contact your provider through [employer] and have the prescription routed to another pharmacy.  Your safety is important to [place]. If you have drug allergies check your prescription carefully.   For the next 24 hours you can use MyChart to ask questions about today's visit, request a non-urgent call back, or ask for a work or school excuse. You will get an email in the next two days asking about your experience. I hope that your e-visit has been  valuable and will speed your recovery.   I spent approximately 5 minutes reviewing the patient's history, current symptoms and coordinating their care today.

## 2022-10-03 ENCOUNTER — Inpatient Hospital Stay (HOSPITAL_COMMUNITY)
Admission: AD | Admit: 2022-10-03 | Discharge: 2022-10-03 | Disposition: A | Discharge status: Home / Self Care | Payer: Federal, State, Local not specified - PPO | Attending: Obstetrics and Gynecology | Admitting: Obstetrics and Gynecology

## 2022-10-03 ENCOUNTER — Encounter (HOSPITAL_COMMUNITY): Payer: Self-pay | Admitting: Obstetrics and Gynecology

## 2022-10-03 DIAGNOSIS — O09213 Supervision of pregnancy with history of pre-term labor, third trimester: Secondary | ICD-10-CM | POA: Diagnosis not present

## 2022-10-03 DIAGNOSIS — M899 Disorder of bone, unspecified: Secondary | ICD-10-CM

## 2022-10-03 DIAGNOSIS — Z3A31 31 weeks gestation of pregnancy: Secondary | ICD-10-CM | POA: Diagnosis not present

## 2022-10-03 DIAGNOSIS — O26893 Other specified pregnancy related conditions, third trimester: Secondary | ICD-10-CM | POA: Diagnosis present

## 2022-10-03 DIAGNOSIS — R102 Pelvic and perineal pain: Secondary | ICD-10-CM | POA: Diagnosis not present

## 2022-10-03 DIAGNOSIS — M549 Dorsalgia, unspecified: Secondary | ICD-10-CM | POA: Diagnosis present

## 2022-10-03 LAB — URINALYSIS, ROUTINE W REFLEX MICROSCOPIC
Bacteria, UA: NONE SEEN
Bilirubin Urine: NEGATIVE
Glucose, UA: NEGATIVE mg/dL
Hgb urine dipstick: NEGATIVE
Ketones, ur: NEGATIVE mg/dL
Leukocytes,Ua: NEGATIVE
Nitrite: NEGATIVE
Protein, ur: NEGATIVE mg/dL
Specific Gravity, Urine: 1.014 (ref 1.005–1.030)
pH: 6 (ref 5.0–8.0)

## 2022-10-03 MED ORDER — HYDROMORPHONE HCL 2 MG PO TABS
2.0000 mg | ORAL_TABLET | Freq: Once | ORAL | Status: AC
  Administered 2022-10-03: 2 mg via ORAL
  Filled 2022-10-03: qty 1

## 2022-10-03 NOTE — [facility] Provider Note (Signed)
History     [id number]  Arrival date and time: 10/03/22 1703    Chief Complaint  Patient presents with   Abdominal Pain   Back Pain     HPI April Osborn is a 32 y.o. at [redacted]w[redacted]d with PMHx notable for preterm delivery, who presents for pelvic pain.   Patient reports several weeks of pelvic pain that acutely worsened today Pain is central, in her pubic bone Feels like it radiates down her legs and into her back No vaginal bleeding or leaking fluid No vaginal discharge No burning or pain with urination No fevers Occasional contractions but none at present Fetal movement is normal Has tried flexeril but doesn't feel it helps Has tried maternity belt but also didn't feel that it helped much   --/--/A POS (03/23 1138)  OB History     Gravida  3   Para  2   Term  1   Preterm  1   AB      Living  2      SAB      IAB      Ectopic      Multiple  0   Live Births  2           Past Medical History:  Diagnosis Date   Medical history non-contributory     Past Surgical History:  Procedure Laterality Date   NO PAST SURGERIES      Family History  Problem Relation Age of Onset   Cancer Maternal Grandmother     Social History   Socioeconomic History   Marital status: Significant Other    Spouse name: Not on file   Number of children: Not on file   Years of education: Not on file   Highest education level: Not on file  Occupational History   Not on file  Tobacco Use   Smoking status: Never   Smokeless tobacco: Never  Vaping Use   Vaping Use: Never used  Substance and Sexual Activity   Alcohol use: Never   Drug use: Never   Sexual activity: Not Currently    Birth control/protection: None  Other Topics Concern   Not on file  Social History Narrative   Not on file   Social Determinants of Health   Financial Resource Strain: Not on file  Food Insecurity: No Food Insecurity (06/30/2022)   Hunger Vital Sign    Worried About Running  Out of Food in the Last Year: Never true    Ran Out of Food in the Last Year: Never true  Transportation Needs: No Transportation Needs (06/30/2022)   PRAPARE - Administrator, Civil Service (Medical): No    Lack of Transportation (Non-Medical): No  Physical Activity: Not on file  Stress: Not on file  Social Connections: Not on file  Intimate Partner Violence: Not At Risk (06/30/2022)   Humiliation, Afraid, Rape, and Kick questionnaire    Fear of Current or Ex-Partner: No    Emotionally Abused: No    Physically Abused: No    Sexually Abused: No    Allergies  Allergen Reactions   Oxy Ir [Oxycodone] Itching and Rash    No current facility-administered medications on file prior to encounter.   Current Outpatient Medications on File Prior to Encounter  Medication Sig Dispense Refill   famotidine (PEPCID) 20 MG tablet Take 1 tablet (20 mg total) by mouth 2 (two) times daily. 30 tablet 0   Prenatal Vit-Fe Fumarate-FA (MULTIVITAMIN-PRENATAL) 27-0.8 MG  TABS tablet Take 1 tablet by mouth daily at 12 noon.     sertraline (ZOLOFT) 50 MG tablet Take 50 mg by mouth daily.     cephALEXin (KEFLEX) 500 MG capsule Take 1 capsule (500 mg total) by mouth 4 (four) times daily. 28 capsule 0   COVID-19 mRNA vaccine 2023-2024 (COMIRNATY) syringe Inject 0.3 mLs into the muscle. 0.3 mL 0   loratadine (CLARITIN) 10 MG tablet Take 1 tablet (10 mg total) by mouth daily. 30 tablet 11   ondansetron (ZOFRAN) 4 MG tablet Take 1 tablet (4 mg total) by mouth every 8 (eight) hours as needed for nausea or vomiting. 20 tablet 0   promethazine (PHENERGAN) 25 MG tablet Take 1 tablet (25 mg total) by mouth every 6 (six) hours as needed for nausea or vomiting. 30 tablet 0     ROS Pertinent positives and negative per HPI, all others reviewed and negative  Physical Exam   BP 117/64   Pulse 87   Temp 98.4 F (36.9 C) (Oral)   Resp 18   Ht 5\' 8"  (1.727 m)   Wt 105.1 kg   LMP 02/05/2022   SpO2 98%    BMI 35.21 kg/m   Patient Vitals for the past 24 hrs:  BP Temp Temp src Pulse Resp SpO2 Height Weight  10/03/22 1750 -- -- -- -- -- 98 % -- --  10/03/22 1747 117/64 -- -- 87 -- -- -- --  10/03/22 1740 -- -- -- -- -- 98 % -- --  10/03/22 1735 -- -- -- -- -- 97 % -- --  10/03/22 1734 118/62 -- -- (!) 118 -- -- -- --  10/03/22 1720 130/71 98.4 F (36.9 C) Oral 95 18 97 % 5\' 8"  (1.727 m) 105.1 kg    Physical Exam Vitals reviewed.  Constitutional:      General: She is not in acute distress.    Appearance: She is well-developed. She is not diaphoretic.  Eyes:     General: No scleral icterus. Pulmonary:     Effort: Pulmonary effort is normal. No respiratory distress.  Abdominal:     General: There is no distension.     Palpations: Abdomen is soft.     Tenderness: There is no abdominal tenderness. There is no guarding or rebound.  Musculoskeletal:     Comments: Significant tenderness to palpation of the pubic bone  Skin:    General: Skin is warm and dry.  Neurological:     Mental Status: She is alert.     Coordination: Coordination normal.      Cervical Exam Dilation: Closed Exam by:: Dr. Crissie Reese, MD  Bedside Ultrasound Not performed.  My interpretation: n/a  FHT Baseline: 150 bpm Variability: Good {> 6 bpm) Accelerations: Reactive Decelerations: Absent Uterine activity: None Cat: I  Labs No results found for this or any previous visit (from the past 24 hour(s)).  Imaging No results found.  MAU Course  Procedures Lab Orders         Urinalysis, Routine w reflex microscopic -Urine, Clean Catch    Meds ordered this encounter  Medications   HYDROmorphone (DILAUDID) tablet 2 mg   Imaging Orders  No imaging studies ordered today    MDM Moderate (Level 3-4)  Assessment and Plan  #Pubic symphysis pain #[redacted] weeks gestation of pregnancy No symptoms of infection. Cervix closed, unlikely preterm labor. Most likely pubic symphysis separation, discussed etiology  and recommendation for PT which she can discuss with private OB. Also reviewed  wearing maternity belt lower around hips to help counteract separation. Offered pain relief which patient accepted.   #FWB FHT Cat I NST: Reactive   Dispo: discharged to home in stable condition    Venora Maples, MD/MPH 10/03/22 6:17 PM  Allergies as of 10/03/2022       Reactions   Oxy Ir [oxycodone] Itching, Rash        Medication List     TAKE these medications    cephALEXin 500 MG capsule Commonly known as: Keflex Take 1 capsule (500 mg total) by mouth 4 (four) times daily.   Comirnaty syringe Generic drug: COVID-19 mRNA vaccine 2023-2024 Inject 0.3 mLs into the muscle.   famotidine 20 MG tablet Commonly known as: PEPCID Take 1 tablet (20 mg total) by mouth 2 (two) times daily.   loratadine 10 MG tablet Commonly known as: CLARITIN Take 1 tablet (10 mg total) by mouth daily.   multivitamin-prenatal 27-0.8 MG Tabs tablet Take 1 tablet by mouth daily at 12 noon.   ondansetron 4 MG tablet Commonly known as: Zofran Take 1 tablet (4 mg total) by mouth every 8 (eight) hours as needed for nausea or vomiting.   promethazine 25 MG tablet Commonly known as: PHENERGAN Take 1 tablet (25 mg total) by mouth every 6 (six) hours as needed for nausea or vomiting.   sertraline 50 MG tablet Commonly known as: ZOLOFT Take 50 mg by mouth daily.

## 2022-10-03 NOTE — MAU Note (Signed)
...  April Osborn is a 32 y.o. at [redacted]w[redacted]d here in [facility]U reporting: Intermittent lower abdominal pain as well as lower back pain. She reports she has had these pains for one month now but reports she slipped earlier this morning around 1130 and since then her pains have gotten worse. She denies falling and reports she was able to catch herself. Denies recent IC. Endorses clear/white, thin, and odorless vaginal discharge. Denies VB or LOF. +FM.  Has not taken anything for the pain.  Onset of complaint: 1130  Pain scores: 10/10 lower abdomen 7/10 lower back  FHT: 134 initial external Lab orders placed from triage: UA

## 2022-10-24 ENCOUNTER — Encounter (HOSPITAL_COMMUNITY): Payer: Self-pay | Admitting: Obstetrics and Gynecology

## 2022-10-24 ENCOUNTER — Inpatient Hospital Stay (HOSPITAL_COMMUNITY)
Admission: AD | Admit: 2022-10-24 | Discharge: 2022-10-24 | Disposition: A | Discharge status: Home / Self Care | Payer: Federal, State, Local not specified - PPO | Attending: Obstetrics and Gynecology | Admitting: Obstetrics and Gynecology

## 2022-10-24 ENCOUNTER — Inpatient Hospital Stay (HOSPITAL_COMMUNITY): Payer: Federal, State, Local not specified - PPO

## 2022-10-24 DIAGNOSIS — R002 Palpitations: Secondary | ICD-10-CM | POA: Insufficient documentation

## 2022-10-24 DIAGNOSIS — O09213 Supervision of pregnancy with history of pre-term labor, third trimester: Secondary | ICD-10-CM | POA: Diagnosis not present

## 2022-10-24 DIAGNOSIS — F419 Anxiety disorder, unspecified: Secondary | ICD-10-CM | POA: Diagnosis not present

## 2022-10-24 DIAGNOSIS — R079 Chest pain, unspecified: Secondary | ICD-10-CM | POA: Insufficient documentation

## 2022-10-24 DIAGNOSIS — O1493 Unspecified pre-eclampsia, third trimester: Secondary | ICD-10-CM | POA: Diagnosis not present

## 2022-10-24 DIAGNOSIS — O99891 Other specified diseases and conditions complicating pregnancy: Secondary | ICD-10-CM

## 2022-10-24 DIAGNOSIS — R109 Unspecified abdominal pain: Secondary | ICD-10-CM | POA: Insufficient documentation

## 2022-10-24 DIAGNOSIS — Z3A34 34 weeks gestation of pregnancy: Secondary | ICD-10-CM | POA: Insufficient documentation

## 2022-10-24 DIAGNOSIS — O26893 Other specified pregnancy related conditions, third trimester: Secondary | ICD-10-CM | POA: Insufficient documentation

## 2022-10-24 DIAGNOSIS — R102 Pelvic and perineal pain: Secondary | ICD-10-CM | POA: Diagnosis not present

## 2022-10-24 DIAGNOSIS — O99343 Other mental disorders complicating pregnancy, third trimester: Secondary | ICD-10-CM | POA: Diagnosis not present

## 2022-10-24 DIAGNOSIS — R519 Headache, unspecified: Secondary | ICD-10-CM

## 2022-10-24 LAB — URINALYSIS, ROUTINE W REFLEX MICROSCOPIC
Bilirubin Urine: NEGATIVE
Glucose, UA: NEGATIVE mg/dL
Hgb urine dipstick: NEGATIVE
Ketones, ur: NEGATIVE mg/dL
Leukocytes,Ua: NEGATIVE
Nitrite: NEGATIVE
Protein, ur: NEGATIVE mg/dL
Specific Gravity, Urine: 1.016 (ref 1.005–1.030)
pH: 6 (ref 5.0–8.0)

## 2022-10-24 LAB — COMPREHENSIVE METABOLIC PANEL
ALT: 11 U/L (ref 0–44)
AST: 20 U/L (ref 15–41)
Albumin: 2.5 g/dL — ABNORMAL LOW (ref 3.5–5.0)
Alkaline Phosphatase: 98 U/L (ref 38–126)
Anion gap: 11 (ref 5–15)
BUN: 7 mg/dL (ref 6–20)
CO2: 21 mmol/L — ABNORMAL LOW (ref 22–32)
Calcium: 9 mg/dL (ref 8.9–10.3)
Chloride: 102 mmol/L (ref 98–111)
Creatinine, Ser: 0.74 mg/dL (ref 0.44–1.00)
GFR, Estimated: >60 mL/min (ref >60)
Glucose, Bld: 116 mg/dL — ABNORMAL HIGH (ref 70–99)
Potassium: 3.8 mmol/L (ref 3.5–5.1)
Sodium: 134 mmol/L — ABNORMAL LOW (ref 135–145)
Total Bilirubin: 0.3 mg/dL (ref 0.3–1.2)
Total Protein: 6.1 g/dL — ABNORMAL LOW (ref 6.5–8.1)

## 2022-10-24 LAB — CBC
HCT: 33.4 % — ABNORMAL LOW (ref 36.0–46.0)
Hemoglobin: 10.6 g/dL — ABNORMAL LOW (ref 12.0–15.0)
MCH: 28.3 pg (ref 26.0–34.0)
MCHC: 31.7 g/dL (ref 30.0–36.0)
MCV: 89.1 fL (ref 80.0–100.0)
Platelets: 197 10*3/uL (ref 150–400)
RBC: 3.75 MIL/uL — ABNORMAL LOW (ref 3.87–5.11)
RDW: 14 % (ref 11.5–15.5)
WBC: 11 10*3/uL — ABNORMAL HIGH (ref 4.0–10.5)
nRBC: 0 % (ref 0.0–0.2)

## 2022-10-24 LAB — CK: Total CK: 57 U/L (ref 38–234)

## 2022-10-24 LAB — D-DIMER, QUANTITATIVE: D-Dimer, Quant: 1.34 ug/mL-FEU — ABNORMAL HIGH (ref 0.00–0.50)

## 2022-10-24 LAB — BRAIN NATRIURETIC PEPTIDE: B Natriuretic Peptide: 39.3 pg/mL (ref 0.0–100.0)

## 2022-10-24 LAB — TROPONIN I (HIGH SENSITIVITY)
Troponin I (High Sensitivity): 6 ng/L (ref <18)
Troponin I (High Sensitivity): 5 ng/L (ref <18)

## 2022-10-24 LAB — PROTEIN / CREATININE RATIO, URINE
Creatinine, Urine: 165 mg/dL
Protein Creatinine Ratio: 0.13 mg/mg{Cre} (ref 0.00–0.15)
Total Protein, Urine: 21 mg/dL

## 2022-10-24 MED ORDER — DIPHENHYDRAMINE HCL 25 MG PO TABS: 25.0000 mg | ORAL_TABLET | Freq: Four times a day (QID) | ORAL | 0 refills | Status: DC | PRN

## 2022-10-24 MED ORDER — DIPHENHYDRAMINE HCL 25 MG PO CAPS
25.0000 mg | ORAL_CAPSULE | Freq: Once | ORAL | Status: AC
  Administered 2022-10-24: 25 mg via ORAL
  Filled 2022-10-24: qty 1

## 2022-10-24 MED ORDER — OXYCODONE HCL 5 MG PO TABS: 5.0000 mg | ORAL_TABLET | Freq: Once | ORAL | Status: DC

## 2022-10-24 MED ORDER — ACETAMINOPHEN-CAFFEINE 500-65 MG PO TABS
2.0000 | ORAL_TABLET | Freq: Once | ORAL | Status: AC
  Administered 2022-10-24: 2 via ORAL
  Filled 2022-10-24: qty 2

## 2022-10-24 MED ORDER — SERTRALINE HCL 100 MG PO TABS: 100.0000 mg | ORAL_TABLET | Freq: Every day | ORAL | 3 refills | Status: DC

## 2022-10-24 MED ORDER — EXCEDRIN TENSION HEADACHE 500-65 MG PO TABS: 2.0000 | ORAL_TABLET | Freq: Four times a day (QID) | ORAL | 0 refills | Status: DC | PRN

## 2022-10-24 MED ORDER — LACTATED RINGERS IV BOLUS
1000.0000 mL | Freq: Once | INTRAVENOUS | Status: AC
  Administered 2022-10-24: 1000 mL via INTRAVENOUS

## 2022-10-24 MED ORDER — HYDROXYZINE HCL 10 MG PO TABS: 10.0000 mg | ORAL_TABLET | Freq: Three times a day (TID) | ORAL | 2 refills | Status: DC | PRN

## 2022-10-24 MED ORDER — HYDROMORPHONE HCL 2 MG PO TABS
2.0000 mg | ORAL_TABLET | Freq: Once | ORAL | Status: AC
  Administered 2022-10-24: 2 mg via ORAL
  Filled 2022-10-24: qty 1

## 2022-10-24 NOTE — MAU Provider Note (Signed)
History     [id number]  Arrival date and time: 10/24/22 1141    Chief Complaint  Patient presents with   Abdominal Pain   Headache   Chest Pain     HPI April Osborn is a 32 y.o. at [redacted]w[redacted]d with PMHx notable for preterm delivery at 62 weeks x2, anxiety, pyelo earlier this pregnancy, who presents for evaluation of multiple symptoms.   Recommended by doctor to present for evaluation due to multiple symptoms Doctor concerned for pre-eclampsia BP in office today was 130/80  Has been having cramps and a lot of pressure Has been on and off for about a week They last all day when she gets them Feels like they alternate with braxton-hicks contractions No leaking fluid or vaginal bleeding Baby is moving normally Has not taken anything for the cramps, has tried tylenol and flexeril in the past with no improvement in symptoms so not using currently  Has had migraines in the past but never during pregnancy Has headache right now, located in forehead/temples, also feels tension in her neck and shoulders Feels like vision is blurry also when she has a headache but not at other times No numbness or tingling anywhere in her body Does feel generally fatigued  Has been having episodes when she stands where she feels dizzy, weak, like she's going to fall down Feels palpitations when that happens as well  Has been having chest pain intermittently Worried more about her palpitations Sometimes feels like someone is sitting on her chest Usually feels like pressure, sometimes is sharp Pressure/pain is central Has had many times while lying down at rest, but also with exertion Usually pressure/pain/palpitations last for minutes Has been able to do deep breaths and breath holding to make it go away   Review of outside prenatal records from [facility] (in media tab): last updated records from 09/05/2022, problem list reviewed, unremarkable labs, has not had any elevated BP's  in the office to date   --/--/A POS (03/23 1138)  OB History     Gravida  3   Para  2   Term  1   Preterm  1   AB      Living  2      SAB      IAB      Ectopic      Multiple  0   Live Births  2           Past Medical History:  Diagnosis Date   Medical history non-contributory     Past Surgical History:  Procedure Laterality Date   NO PAST SURGERIES      Family History  Problem Relation Age of Onset   Cancer Maternal Grandmother     Social History   Socioeconomic History   Marital status: Significant Other    Spouse name: Not on file   Number of children: Not on file   Years of education: Not on file   Highest education level: Not on file  Occupational History   Not on file  Tobacco Use   Smoking status: Never   Smokeless tobacco: Never  Vaping Use   Vaping status: Never Used  Substance and Sexual Activity   Alcohol use: Never   Drug use: Never   Sexual activity: Not Currently    Birth control/protection: None  Other Topics Concern   Not on file  Social History Narrative   Not on file   Social Determinants of Health  Financial Resource Strain: Not on file  Food Insecurity: No Food Insecurity (06/30/2022)   Hunger Vital Sign    Worried About Running Out of Food in the Last Year: Never true    Ran Out of Food in the Last Year: Never true  Transportation Needs: No Transportation Needs (06/30/2022)   PRAPARE - Administrator, Civil Service (Medical): No    Lack of Transportation (Non-Medical): No  Physical Activity: Not on file  Stress: Not on file  Social Connections: Not on file  Intimate Partner Violence: Not At Risk (06/30/2022)   Humiliation, Afraid, Rape, and Kick questionnaire    Fear of Current or Ex-Partner: No    Emotionally Abused: No    Physically Abused: No    Sexually Abused: No    Allergies  Allergen Reactions   Oxy Ir [Oxycodone] Itching and Rash    No current facility-administered medications  on file prior to encounter.   Current Outpatient Medications on File Prior to Encounter  Medication Sig Dispense Refill   cephALEXin (KEFLEX) 500 MG capsule Take 1 capsule (500 mg total) by mouth 4 (four) times daily. 28 capsule 0   COVID-19 mRNA vaccine 2023-2024 (COMIRNATY) syringe Inject 0.3 mLs into the muscle. 0.3 mL 0   famotidine (PEPCID) 20 MG tablet Take 1 tablet (20 mg total) by mouth 2 (two) times daily. 30 tablet 0   loratadine (CLARITIN) 10 MG tablet Take 1 tablet (10 mg total) by mouth daily. 30 tablet 11   ondansetron (ZOFRAN) 4 MG tablet Take 1 tablet (4 mg total) by mouth every 8 (eight) hours as needed for nausea or vomiting. 20 tablet 0   Prenatal Vit-Fe Fumarate-FA (MULTIVITAMIN-PRENATAL) 27-0.8 MG TABS tablet Take 1 tablet by mouth daily at 12 noon.     promethazine (PHENERGAN) 25 MG tablet Take 1 tablet (25 mg total) by mouth every 6 (six) hours as needed for nausea or vomiting. 30 tablet 0     ROS Pertinent positives and negative per HPI, all others reviewed and negative  Physical Exam   BP 121/75   Pulse 95   Temp 98.4 F (36.9 C) (Oral)   Resp 18   Ht 5\' 8"  (1.727 m)   Wt 105.8 kg   LMP 02/05/2022   SpO2 98%   BMI 35.47 kg/m   Patient Vitals for the past 24 hrs:  BP Temp Temp src Pulse Resp SpO2 Height Weight  10/24/22 1316 121/75 -- -- 95 -- 98 % -- --  10/24/22 1301 (!) 109/50 -- -- 96 -- 98 % -- --  10/24/22 1246 (!) 108/59 -- -- (!) 105 -- 97 % -- --  10/24/22 1232 (!) 109/51 -- -- 93 -- -- -- --  10/24/22 1219 133/74 -- -- 95 -- -- -- --  10/24/22 1212 134/71 98.4 F (36.9 C) Oral (!) 121 18 99 % 5\' 8"  (1.727 m) 105.8 kg    Physical Exam Constitutional:      General: She is not in acute distress.    Appearance: She is well-developed. She is not ill-appearing, toxic-appearing or diaphoretic.  HENT:     Head: Normocephalic and atraumatic.     Mouth/Throat:     Mouth: Mucous membranes are moist.  Eyes:     General: No scleral  icterus. Cardiovascular:     Rate and Rhythm: Normal rate and regular rhythm.     Heart sounds: Normal heart sounds.  Pulmonary:     Effort: Pulmonary effort is normal.  Breath sounds: Normal breath sounds.  Abdominal:     General: Injury: gravid uterus.     Tenderness: There is no abdominal tenderness.  Skin:    General: Skin is warm and dry.  Neurological:     General: No focal deficit present.     Mental Status: She is alert and oriented to person, place, and time.     Cranial Nerves: No cranial nerve deficit.  Psychiatric:        Mood and Affect: Mood normal.        Behavior: Behavior normal.      Cervical Exam    Bedside Ultrasound Not performed.  My interpretation: n/a  FHT Baseline: 125 bpm Variability: Good {> 6 bpm) Accelerations: Reactive Decelerations: Absent Uterine activity: None Cat: I  Labs Results for orders placed or performed during the hospital encounter of 10/24/22 (from the past 24 hour(s))  Urinalysis, Routine w reflex microscopic -Urine, Clean Catch     Status: Abnormal   Collection Time: 10/24/22 12:36 PM  Result Value Ref Range   Color, Urine YELLOW YELLOW   APPearance HAZY (A) CLEAR   Specific Gravity, Urine 1.016 1.005 - 1.030   pH 6.0 5.0 - 8.0   Glucose, UA NEGATIVE NEGATIVE mg/dL   Hgb urine dipstick NEGATIVE NEGATIVE   Bilirubin Urine NEGATIVE NEGATIVE   Ketones, ur NEGATIVE NEGATIVE mg/dL   Protein, ur NEGATIVE NEGATIVE mg/dL   Nitrite NEGATIVE NEGATIVE   Leukocytes,Ua NEGATIVE NEGATIVE  Comprehensive metabolic panel     Status: Abnormal   Collection Time: 10/24/22  1:01 PM  Result Value Ref Range   Sodium 134 (L) 135 - 145 mmol/L   Potassium 3.8 3.5 - 5.1 mmol/L   Chloride 102 98 - 111 mmol/L   CO2 21 (L) 22 - 32 mmol/L   Glucose, Bld 116 (H) 70 - 99 mg/dL   BUN 7 6 - 20 mg/dL   Creatinine, Ser 8.41 0.44 - 1.00 mg/dL   Calcium 9.0 8.9 - 32.4 mg/dL   Total Protein 6.1 (L) 6.5 - 8.1 g/dL   Albumin 2.5 (L) 3.5 - 5.0  g/dL   AST 20 15 - 41 U/L   ALT 11 0 - 44 U/L   Alkaline Phosphatase 98 38 - 126 U/L   Total Bilirubin 0.3 0.3 - 1.2 mg/dL   GFR, Estimated >40 >10 mL/min   Anion gap 11 5 - 15  Troponin I (High Sensitivity)     Status: None   Collection Time: 10/24/22  1:01 PM  Result Value Ref Range   Troponin I (High Sensitivity) 6 <18 ng/L  CK     Status: None   Collection Time: 10/24/22  1:01 PM  Result Value Ref Range   Total CK 57 38 - 234 U/L  D-dimer, quantitative     Status: Abnormal   Collection Time: 10/24/22  1:01 PM  Result Value Ref Range   D-Dimer, Quant 1.34 (H) 0.00 - 0.50 ug/mL-FEU  Brain natriuretic peptide     Status: None   Collection Time: 10/24/22  1:08 PM  Result Value Ref Range   B Natriuretic Peptide 39.3 0.0 - 100.0 pg/mL  CBC     Status: Abnormal   Collection Time: 10/24/22  1:33 PM  Result Value Ref Range   WBC 11.0 (H) 4.0 - 10.5 K/uL   RBC 3.75 (L) 3.87 - 5.11 MIL/uL   Hemoglobin 10.6 (L) 12.0 - 15.0 g/dL   HCT 27.2 (L) 53.6 - 64.4 %  MCV 89.1 80.0 - 100.0 fL   MCH 28.3 26.0 - 34.0 pg   MCHC 31.7 30.0 - 36.0 g/dL   RDW 65.7 84.6 - 96.2 %   Platelets 197 150 - 400 K/uL   nRBC 0.0 0.0 - 0.2 %  Protein / creatinine ratio, urine     Status: None   Collection Time: 10/24/22  2:44 PM  Result Value Ref Range   Creatinine, Urine 165 mg/dL   Total Protein, Urine 21 mg/dL   Protein Creatinine Ratio 0.13 0.00 - 0.15 mg/mg[Cre]    Imaging DG Chest Portable 1 View  Result Date: 10/24/2022 CLINICAL DATA:  chest pain, pregnant [redacted] weeks EXAM: PORTABLE CHEST 1 VIEW COMPARISON:  05/18/2021. FINDINGS: Bilateral lung fields are clear. Bilateral costophrenic angles are clear. Normal cardio-mediastinal silhouette. No acute osseous abnormalities. The soft tissues are within normal limits. IMPRESSION: No active disease. Electronically Signed   By: Jules Schick M.D.   On: 10/24/2022 14:03    MAU Course  Procedures Lab Orders         Urinalysis, Routine w reflex  microscopic -Urine, Clean Catch         Comprehensive metabolic panel         Protein / creatinine ratio, urine         CK         Brain natriuretic peptide         D-dimer, quantitative         CBC    Meds ordered this encounter  Medications   lactated ringers bolus 1,000 mL   acetaminophen-caffeine (EXCEDRIN TENSION HEADACHE) 500-65 MG per tablet 2 tablet   diphenhydrAMINE (BENADRYL) capsule 25 mg   DISCONTD: oxyCODONE (Oxy IR/ROXICODONE) immediate release tablet 5 mg   HYDROmorphone (DILAUDID) tablet 2 mg   sertraline (ZOLOFT) 100 MG tablet    Sig: Take 1 tablet (100 mg total) by mouth at bedtime.    Dispense:  90 tablet    Refill:  3   hydrOXYzine (ATARAX) 10 MG tablet    Sig: Take 1 tablet (10 mg total) by mouth 3 (three) times daily as needed.    Dispense:  30 tablet    Refill:  2   acetaminophen-caffeine (EXCEDRIN TENSION HEADACHE) 500-65 MG TABS per tablet    Sig: Take 2 tablets by mouth every 6 (six) hours as needed (headache).    Dispense:  60 tablet    Refill:  0   diphenhydrAMINE (BENADRYL) 25 MG tablet    Sig: Take 1 tablet (25 mg total) by mouth every 6 (six) hours as needed.    Dispense:  30 tablet    Refill:  0   Imaging Orders         DG Chest Portable 1 View      MDM Moderate (Level 3-4)  Assessment and Plan  #Headache in pregnancy Normotensive throughout MAU stay. PreE labs normal. Much improved symptoms with headache cocktail as outlined above.   #Chest pain #Palpitations Some elements raise concern for SVT. Has been previously referred to Cardio OB clinic by her primary OB and has an appt soon. Cardiac workup negative, with chest XR, ECG, trop, BNP, and d dimer all normal or appropriate for gestational age.   #Pelvic pressure Discussed this is common in later stages of subsequent pregnancies.  #Anxiety Reports she does not feel sertraline is effective at current dose. Increased to 100 mg daily at time of discharge, also given rx for hydroxyzine  which has been  effective for her in the past.    #FWB FHT Cat I NST: Reactive   Dispo: discharged to home in stable condition    Venora Maples, MD/MPH 10/24/22 4:12 PM  Allergies as of 10/24/2022       Reactions   Oxy Ir [oxycodone] Itching, Rash        Medication List     TAKE these medications    cephALEXin 500 MG capsule Commonly known as: Keflex Take 1 capsule (500 mg total) by mouth 4 (four) times daily.   Comirnaty syringe Generic drug: COVID-19 mRNA vaccine 2023-2024 Inject 0.3 mLs into the muscle.   diphenhydrAMINE 25 MG tablet Commonly known as: BENADRYL Take 1 tablet (25 mg total) by mouth every 6 (six) hours as needed.   Excedrin Tension Headache 500-65 MG Tabs per tablet Generic drug: acetaminophen-caffeine Take 2 tablets by mouth every 6 (six) hours as needed (headache).   famotidine 20 MG tablet Commonly known as: PEPCID Take 1 tablet (20 mg total) by mouth 2 (two) times daily.   hydrOXYzine 10 MG tablet Commonly known as: ATARAX Take 1 tablet (10 mg total) by mouth 3 (three) times daily as needed.   loratadine 10 MG tablet Commonly known as: CLARITIN Take 1 tablet (10 mg total) by mouth daily.   multivitamin-prenatal 27-0.8 MG Tabs tablet Take 1 tablet by mouth daily at 12 noon.   ondansetron 4 MG tablet Commonly known as: Zofran Take 1 tablet (4 mg total) by mouth every 8 (eight) hours as needed for nausea or vomiting.   promethazine 25 MG tablet Commonly known as: PHENERGAN Take 1 tablet (25 mg total) by mouth every 6 (six) hours as needed for nausea or vomiting.   sertraline 100 MG tablet Commonly known as: ZOLOFT Take 1 tablet (100 mg total) by mouth at bedtime. What changed:  medication strength how much to take when to take this

## 2022-10-24 NOTE — MAU Note (Signed)
.  April Osborn is a 32 y.o. at [redacted]w[redacted]d here in MAU reporting: sent over from the office for Pre-E eval. Patient reports chest pain that feels like someone is sitting on her chest that has been on-going for several months (5/10). Her OB told her to schedule an appt with Cardiology and she has a visit with them scheduled for next month. She also reports heart palpitations daily. She also reports HA that has been on-going (8/10). She has been having lower abdominal cramping that she describes as period cramps that has been on-going for the past month (8/10). Today she started having right/left lateral rib pain that feels like someone punched her (7/10). Her BP was 130/80 in the office.  PIH Assessment: Headache present: Yes ;No treatment attempted Visual disturbances: Blurred; for 1 week RUQ pain/Epigastric: None Atypical edema: Face and hands, on-going  Hx of HBP: Patient denies BP Medications: None prescribed  Vag. Bleeding: None  Membrane Status: Intact and Amount: None  Vaginal Discharge Amount: None  Fetal Movement: Reports positive FM  FHT: Fetal Heart Rate Mode: External Baseline Rate (A): 135 bpm  LMP: Patient's last menstrual period was 02/05/2022.   Vitals:   10/24/22 1212  BP: 134/71  Pulse: (!) 121  Resp: 18  Temp: 98.4 F (36.9 C)  SpO2: 99%     OB Office: [facility] Lab orders placed from triage: Urinalysis

## 2022-10-24 NOTE — MAU Note (Signed)
RN called lab to inquire about  protein-creatinine ratio . Lab tech answered and informed RN that they will locate the urine sample and add on lab.

## 2022-10-31 LAB — OB RESULTS CONSOLE GBS: GBS: POSITIVE

## 2022-11-02 ENCOUNTER — Telehealth: Payer: Federal, State, Local not specified - PPO | Admitting: Family Medicine

## 2022-11-02 DIAGNOSIS — L309 Dermatitis, unspecified: Secondary | ICD-10-CM

## 2022-11-02 MED ORDER — PREDNISONE 20 MG PO TABS: 20.0000 mg | ORAL_TABLET | Freq: Every day | ORAL | 0 refills | Status: AC

## 2022-11-02 NOTE — Progress Notes (Signed)
E-Visit for Eczema  We are sorry that you are not feeling well. Here is how we plan to help! Based on what you shared with me it looks like you have eczema (atopic dermatitis).  Although the cause of eczema is not completely understood, genetics appear to play a strong role, and people with a family history of eczema are at increased risk of developing the condition. In most people with eczema, there is a genetic abnormality in the outermost layer of the skin, called the epidermis   Most people with eczema develop their first symptoms as children, before the age of 28. Intense itching of the skin, patches of redness, small bumps, and skin flaking are common. Scratching can further inflame the skin and worsen the itching. The itchiness may be more noticeable at nighttime.  Eczema commonly affects the back of the neck, the elbow creases, and the backs of the knees. Other affected areas may include the face, wrists, and forearms. The skin may become thickened and darkened, or even scarred, from repeated scratching. Eliminating factors that aggravate your eczema symptoms can help to control the symptoms. Possible triggers may include: ? Cold or dry environments ? Sweating ? Emotional stress or anxiety ? Rapid temperature changes ? Exposure to certain chemicals or cleaning solutions, including soaps and detergents, perfumes and cosmetics, wool or synthetic fibers, dust, sand, and cigarette smoke Keeping your skin hydrated Emollients -- Emollients are creams and ointments that moisturize the skin and prevent it from drying out. The best emollients for people with eczema are thick creams (such as Eucerin, Cetaphil, and Nutraderm) or ointments (such as petroleum jelly, Aquaphor, and Vaseline), which contain little to no water. Emollients are most effective when applied immediately after bathing. Emollients can be applied twice a day or more often if needed. Lotions contain more water than creams and  ointments and are less effective for moisturizing the skin. Bathing -- It is not clear if showers or baths are better for keeping the skin hydrated. Lukewarm baths or showers can hydrate and cool the skin, temporarily relieving itching from eczema. An unscented, mild soap or non-soap cleanser (such as Cetaphil) should be used sparingly. Apply an emollient immediately after bathing or showering to prevent your skin from drying out as a result of water evaporation. Emollient bath additives (products you add to the bath water) have not been found to help relieve symptoms. Hot or long baths (more than 10 to 15 minutes) and showers should be avoided since they can dry out the skin.  Based on what you shared with me you may have eczema.   Prednisone 20 mg tablets. Take one daily by mouth for 5 days   I recommend dilute bleach baths for people with eczema. These baths help to decrease the number of bacteria on the skin that can cause infections or worsen symptoms. To prepare a bleach bath, one-fourth to one-half cup of bleach is placed in a full bathtub (about 40 gallons) of water. Bleach baths are usually taken for 5 to 10 minutes twice per week and should be followed by application of an emollient (listed above). I recommend you take Benadryl 25mg  - 50mg  every 4 hours to control the symptoms (including itching) but if they last over 24 hours it is best that you see an office based provider for follow up.  HOME CARE: Take lukewarm showers or baths Apply creams and ointments to prevent the skin from drying (Eucerin, Cetaphil, Nutraderm, petroleum jelly, Aquaphor or Vaseline) - these  products contain less water than other lotions and are more effective for moisturizing the skin Limit exposure to cold or dry environments, sweating, emotional stress and anxiety, rapid temperature changes and exposure to chemicals and cleaning products, soaps and detergents, perfumes, cosmetics, wool and synthetic fibers, dust,  sand and cigarette- factors which can aggravate eczema symptoms.  Use a hydrocortisone cream once or twice a day Take an antihistamine like Benadryl for widespread rashes that itch.  The adult dosage of Benadryl is 25-50 mg by mouth 4 times daily. Caution: This type of medication may cause sleepiness.  Do not drink alcohol, drive, or operate dangerous machinery while taking antihistamines.  Do not take these medications if you have prostate enlargement.  Read the package instructions thoroughly on all medications that you take.  GET HELP RIGHT AWAY IF: Symptoms that don't go away after treatment. Severe itching that persists. You develop a fever. Your skin begins to drain. You have a sore throat. You become short of breath.  MAKE SURE YOU   Understand these instructions. Will watch your condition. Will get help right away if you are not doing well or get worse.    Thank you for choosing an e-visit.  Your e-visit answers were reviewed by a board certified advanced clinical practitioner to complete your personal care plan. Depending upon the condition, your plan could have included both over the counter or prescription medications.  Please review your pharmacy choice. Make sure the pharmacy is open so you can pick up prescription now. If there is a problem, you may contact your provider through [employer] and have the prescription routed to another pharmacy.  Your safety is important to [place]. If you have drug allergies check your prescription carefully.   For the next 24 hours you can use MyChart to ask questions about today's visit, request a non-urgent call back, or ask for a work or school excuse. You will get an email in the next two days asking about your experience. I hope that your e-visit has been valuable and will speed your recovery.  I have provided 5 minutes of non face to face time during this encounter for chart review and documentation.

## 2022-11-08 ENCOUNTER — Encounter (HOSPITAL_COMMUNITY): Payer: Self-pay | Admitting: Obstetrics and Gynecology

## 2022-11-08 ENCOUNTER — Inpatient Hospital Stay (HOSPITAL_COMMUNITY): Payer: Federal, State, Local not specified - PPO | Admitting: Anesthesiology

## 2022-11-08 ENCOUNTER — Inpatient Hospital Stay (HOSPITAL_COMMUNITY)
Admission: AD | Admit: 2022-11-08 | Discharge: 2022-11-10 | DRG: 806 | Disposition: A | Discharge status: Home / Self Care | Payer: Federal, State, Local not specified - PPO | Attending: Obstetrics and Gynecology | Admitting: Obstetrics and Gynecology

## 2022-11-08 ENCOUNTER — Other Ambulatory Visit: Payer: Self-pay

## 2022-11-08 DIAGNOSIS — Z3A37 37 weeks gestation of pregnancy: Secondary | ICD-10-CM

## 2022-11-08 DIAGNOSIS — O99824 Streptococcus B carrier state complicating childbirth: Secondary | ICD-10-CM | POA: Diagnosis present

## 2022-11-08 DIAGNOSIS — Z3490 Encounter for supervision of normal pregnancy, unspecified, unspecified trimester: Principal | ICD-10-CM

## 2022-11-08 DIAGNOSIS — O9081 Anemia of the puerperium: Secondary | ICD-10-CM | POA: Diagnosis not present

## 2022-11-08 DIAGNOSIS — D62 Acute posthemorrhagic anemia: Secondary | ICD-10-CM | POA: Diagnosis not present

## 2022-11-08 DIAGNOSIS — O26893 Other specified pregnancy related conditions, third trimester: Secondary | ICD-10-CM | POA: Diagnosis present

## 2022-11-08 HISTORY — DX: Anxiety disorder, unspecified: F41.9

## 2022-11-08 LAB — TYPE AND SCREEN
ABO/RH(D): A POS
Antibody Screen: NEGATIVE

## 2022-11-08 LAB — CBC
HCT: 36.6 % (ref 36.0–46.0)
Hemoglobin: 11.4 g/dL — ABNORMAL LOW (ref 12.0–15.0)
MCH: 27.3 pg (ref 26.0–34.0)
MCHC: 31.1 g/dL (ref 30.0–36.0)
MCV: 87.8 fL (ref 80.0–100.0)
Platelets: 202 10*3/uL (ref 150–400)
RBC: 4.17 MIL/uL (ref 3.87–5.11)
RDW: 14.3 % (ref 11.5–15.5)
WBC: 13.2 10*3/uL — ABNORMAL HIGH (ref 4.0–10.5)
nRBC: 0 % (ref 0.0–0.2)

## 2022-11-08 LAB — HIV ANTIBODY (ROUTINE TESTING W REFLEX): HIV Screen 4th Generation wRfx: NONREACTIVE

## 2022-11-08 LAB — RPR: RPR Ser Ql: NONREACTIVE

## 2022-11-08 MED ORDER — TETANUS-DIPHTH-ACELL PERTUSSIS 5-2.5-18.5 LF-MCG/0.5 IM SUSY: 0.5000 mL | PREFILLED_SYRINGE | Freq: Once | INTRAMUSCULAR | Status: DC

## 2022-11-08 MED ORDER — SERTRALINE HCL 100 MG PO TABS
100.0000 mg | ORAL_TABLET | Freq: Every day | ORAL | Status: DC
  Administered 2022-11-08 – 2022-11-09 (×2): 100 mg via ORAL
  Filled 2022-11-08 (×3): qty 1

## 2022-11-08 MED ORDER — DIPHENHYDRAMINE HCL 25 MG PO CAPS: 25.0000 mg | ORAL_CAPSULE | Freq: Four times a day (QID) | ORAL | Status: DC | PRN

## 2022-11-08 MED ORDER — MEASLES, MUMPS & RUBELLA VAC IJ SOLR: 0.5000 mL | Freq: Once | INTRAMUSCULAR | Status: DC

## 2022-11-08 MED ORDER — COCONUT OIL OIL
1.0000 | TOPICAL_OIL | Status: DC | PRN
  Administered 2022-11-10: 1 via TOPICAL

## 2022-11-08 MED ORDER — PHENYLEPHRINE 80 MCG/ML (10ML) SYRINGE FOR IV PUSH (FOR BLOOD PRESSURE SUPPORT): 80.0000 ug | PREFILLED_SYRINGE | INTRAVENOUS | Status: DC | PRN

## 2022-11-08 MED ORDER — ACETAMINOPHEN 325 MG PO TABS
650.0000 mg | ORAL_TABLET | ORAL | Status: DC | PRN
  Administered 2022-11-09: 650 mg via ORAL
  Filled 2022-11-08: qty 2

## 2022-11-08 MED ORDER — OXYTOCIN BOLUS FROM INFUSION
333.0000 mL | Freq: Once | INTRAVENOUS | Status: DC
  Administered 2022-11-08: 333 mL via INTRAVENOUS

## 2022-11-08 MED ORDER — EPHEDRINE 5 MG/ML INJ: 10.0000 mg | INTRAVENOUS | Status: DC | PRN

## 2022-11-08 MED ORDER — LACTATED RINGERS IV SOLN: 500.0000 mL | INTRAVENOUS | Status: DC | PRN

## 2022-11-08 MED ORDER — SODIUM CHLORIDE 0.9 % IV SOLN: 1.0000 g | INTRAVENOUS | Status: DC

## 2022-11-08 MED ORDER — FENTANYL CITRATE (PF) 100 MCG/2ML IJ SOLN: 50.0000 ug | INTRAMUSCULAR | Status: DC | PRN

## 2022-11-08 MED ORDER — SIMETHICONE 80 MG PO CHEW: 80.0000 mg | CHEWABLE_TABLET | ORAL | Status: DC | PRN

## 2022-11-08 MED ORDER — IBUPROFEN 600 MG PO TABS
600.0000 mg | ORAL_TABLET | Freq: Four times a day (QID) | ORAL | Status: DC
  Administered 2022-11-08 – 2022-11-10 (×8): 600 mg via ORAL
  Filled 2022-11-08 (×8): qty 1

## 2022-11-08 MED ORDER — SOD CITRATE-CITRIC ACID 500-334 MG/5ML PO SOLN: 30.0000 mL | ORAL | Status: DC | PRN

## 2022-11-08 MED ORDER — LACTATED RINGERS IV SOLN: INTRAVENOUS | Status: DC

## 2022-11-08 MED ORDER — BENZOCAINE-MENTHOL 20-0.5 % EX AERO: 1.0000 | INHALATION_SPRAY | CUTANEOUS | Status: DC | PRN

## 2022-11-08 MED ORDER — ZOLPIDEM TARTRATE 5 MG PO TABS: 5.0000 mg | ORAL_TABLET | Freq: Every evening | ORAL | Status: DC | PRN

## 2022-11-08 MED ORDER — HYDROXYZINE HCL 50 MG PO TABS: 50.0000 mg | ORAL_TABLET | Freq: Four times a day (QID) | ORAL | Status: DC | PRN

## 2022-11-08 MED ORDER — FENTANYL-BUPIVACAINE-NACL 0.5-0.125-0.9 MG/250ML-% EP SOLN
12.0000 mL/h | EPIDURAL | Status: DC | PRN
  Administered 2022-11-08: 12 mL/h via EPIDURAL
  Filled 2022-11-08: qty 250

## 2022-11-08 MED ORDER — OXYTOCIN-SODIUM CHLORIDE 30-0.9 UT/500ML-% IV SOLN
2.5000 [IU]/h | INTRAVENOUS | Status: DC
  Filled 2022-11-08: qty 500

## 2022-11-08 MED ORDER — DIBUCAINE (PERIANAL) 1 % EX OINT: 1.0000 | TOPICAL_OINTMENT | CUTANEOUS | Status: DC | PRN

## 2022-11-08 MED ORDER — LIDOCAINE HCL (PF) 1 % IJ SOLN
INTRAMUSCULAR | Status: DC | PRN
  Administered 2022-11-08: 5 mL via EPIDURAL
  Administered 2022-11-08: 3 mL via EPIDURAL

## 2022-11-08 MED ORDER — WITCH HAZEL-GLYCERIN EX PADS: 1.0000 | MEDICATED_PAD | CUTANEOUS | Status: DC | PRN

## 2022-11-08 MED ORDER — SODIUM CHLORIDE 0.9 % IV SOLN
2.0000 g | Freq: Once | INTRAVENOUS | Status: AC
  Administered 2022-11-08: 2 g via INTRAVENOUS
  Filled 2022-11-08: qty 2000

## 2022-11-08 MED ORDER — LACTATED RINGERS IV SOLN: 500.0000 mL | Freq: Once | INTRAVENOUS | Status: DC

## 2022-11-08 MED ORDER — ONDANSETRON HCL 4 MG PO TABS: 4.0000 mg | ORAL_TABLET | ORAL | Status: DC | PRN

## 2022-11-08 MED ORDER — SENNOSIDES-DOCUSATE SODIUM 8.6-50 MG PO TABS
2.0000 | ORAL_TABLET | ORAL | Status: DC
  Administered 2022-11-08 – 2022-11-09 (×2): 2 via ORAL
  Filled 2022-11-08 (×2): qty 2

## 2022-11-08 MED ORDER — TRAMADOL HCL 50 MG PO TABS: 50.0000 mg | ORAL_TABLET | Freq: Four times a day (QID) | ORAL | Status: DC | PRN

## 2022-11-08 MED ORDER — ONDANSETRON HCL 4 MG/2ML IJ SOLN: 4.0000 mg | INTRAMUSCULAR | Status: DC | PRN

## 2022-11-08 MED ORDER — PRENATAL MULTIVITAMIN CH
1.0000 | ORAL_TABLET | Freq: Every day | ORAL | Status: DC
  Administered 2022-11-09 – 2022-11-10 (×2): 1 via ORAL
  Filled 2022-11-08 (×2): qty 1

## 2022-11-08 MED ORDER — DIPHENHYDRAMINE HCL 50 MG/ML IJ SOLN: 12.5000 mg | INTRAMUSCULAR | Status: DC | PRN

## 2022-11-08 MED ORDER — LIDOCAINE HCL (PF) 1 % IJ SOLN: 30.0000 mL | INTRAMUSCULAR | Status: DC | PRN

## 2022-11-08 MED ORDER — ACETAMINOPHEN 325 MG PO TABS: 650.0000 mg | ORAL_TABLET | ORAL | Status: DC | PRN

## 2022-11-08 MED ORDER — ONDANSETRON HCL 4 MG/2ML IJ SOLN: 4.0000 mg | Freq: Four times a day (QID) | INTRAMUSCULAR | Status: DC | PRN

## 2022-11-08 NOTE — Anesthesia Procedure Notes (Signed)
Epidural Patient location during procedure: OB Start time: 11/08/2022 10:10 AM End time: 11/08/2022 10:16 AM  Staffing Anesthesiologist: Linton Rump, MD Performed: anesthesiologist   Preanesthetic Checklist Completed: patient identified, IV checked, site marked, risks and benefits discussed, surgical consent, monitors and equipment checked, pre-op evaluation and timeout performed  Epidural Patient position: sitting Prep: DuraPrep and site prepped and draped Patient monitoring: continuous pulse ox and blood pressure Approach: midline Location: L3-L4 Injection technique: LOR saline  Needle:  Needle type: Tuohy  Needle gauge: 17 G Needle length: 9 cm and 9 Needle insertion depth: 6 cm Catheter type: closed end flexible Catheter size: 19 Gauge Catheter at skin depth: 11 cm Test dose: negative  Assessment Events: blood not aspirated, no cerebrospinal fluid, injection not painful, no injection resistance, no paresthesia and negative IV test  Additional Notes The patient has requested an epidural for labor pain management. Risks and benefits including, but not limited to, infection, bleeding, local anesthetic toxicity, headache, hypotension, back pain, block failure, etc. were discussed with the patient. The patient expressed understanding and consented to the procedure. I confirmed that the patient has no bleeding disorders and is not taking blood thinners. I confirmed the patient's last platelet count with the nurse. A time-out was performed immediately prior to the procedure. Please see nursing documentation for vital signs. Sterile technique was used throughout the whole procedure. Once LOR achieved, the epidural catheter threaded easily without resistance. Aspiration of the catheter was negative for blood and CSF. The epidural was dosed slowly and an infusion was started.  2 attempt(s)Reason for block:procedure for pain

## 2022-11-08 NOTE — Anesthesia Preprocedure Evaluation (Signed)
Anesthesia Evaluation  Patient identified by MRN, date of birth, ID band Patient awake    Reviewed: Allergy & Precautions, NPO status , Patient's Chart, lab work & pertinent test results  Airway Mallampati: III  TM Distance: >3 FB Neck ROM: Full    Dental   Pulmonary neg pulmonary ROS   Pulmonary exam normal breath sounds clear to auscultation       Cardiovascular negative cardio ROS  Rhythm:Regular Rate:Normal     Neuro/Psych  PSYCHIATRIC DISORDERS Anxiety     negative neurological ROS     GI/Hepatic Neg liver ROS,GERD  Medicated,,  Endo/Other  negative endocrine ROS    Renal/GU Renal disease (h/o pyelonephritis)     Musculoskeletal   Abdominal   Peds  Hematology negative hematology ROS (+)   Anesthesia Other Findings   Reproductive/Obstetrics (+) Pregnancy                             Anesthesia Physical Anesthesia Plan  ASA: 2  Anesthesia Plan: Epidural   Post-op Pain Management:    Induction:   PONV Risk Score and Plan: 2  Airway Management Planned:   Additional Equipment:   Intra-op Plan:   Post-operative Plan:   Informed Consent: I have reviewed the patients History and Physical, chart, labs and discussed the procedure including the risks, benefits and alternatives for the proposed anesthesia with the patient or authorized representative who has indicated his/her understanding and acceptance.       Plan Discussed with: Anesthesiologist  Anesthesia Plan Comments: (I have discussed risks of neuraxial anesthesia including but not limited to infection, bleeding, nerve injury, back pain, headache, seizures, and failure of block. Patient denies bleeding disorders and is not currently anticoagulated. Labs have been reviewed. Risks and benefits discussed. All patient's questions answered.  )       Anesthesia Quick Evaluation

## 2022-11-08 NOTE — H&P (Signed)
April Osborn is a 32 y.o. female presenting for labor. Two prior SVDs - both preterm. Largest 6#7. She was admitted for pyelnonephritits this pregnancy that has resolved. She has a history of anxiety on sertraline 100 mg.  She is rubella non-immune and will need MMR pp. Takes allergy med daily as well. She is gbs positive without a pcn allergy. She has itching when she takes oxycodone.   OB History     Gravida  3   Para  2   Term  0   Preterm  2   AB      Living  2      SAB      IAB      Ectopic      Multiple  0   Live Births  2          Past Medical History:  Diagnosis Date   Anxiety    PCOS (polycystic ovarian syndrome) 2022   Past Surgical History:  Procedure Laterality Date   NO PAST SURGERIES     Family History: family history includes Cancer in her maternal grandmother. Social History:  reports that she has never smoked. She has never used smokeless tobacco. She reports that she does not drink alcohol and does not use drugs.     Maternal Diabetes: No Genetic Screening: Normal Maternal Ultrasounds/Referrals: Normal Fetal Ultrasounds or other Referrals:  None Maternal Substance Abuse:  No Significant Maternal Medications:  None Significant Maternal Lab Results:  Group B Strep positive Number of Prenatal Visits:greater than 3 verified prenatal visits Other Comments:  None  Review of Systems History Dilation: 4.5 Effacement (%): 100 Station: -2 Exam by:: K Faucett RN Blood pressure (!) 140/87, pulse 83, temperature 98 F (36.7 C), temperature source Oral, resp. rate 16, height 5\' 8"  (1.727 m), weight 105.6 kg, last menstrual period 02/05/2022, unknown if currently breastfeeding. Exam Physical Exam  NAD, A&O NWOB Abd soft, nondistended, gravid  Prenatal labs: ABO, Rh: --/--/A POS (03/23 1138) Antibody: NEG (03/23 1138) Rubella:   RPR: Nonreactive (05/22 0000)  HBsAg: Negative (01/17 0000)  HIV: Non-reactive (05/22 0000)  GBS:  Positive/-- (07/24 0000)   Assessment/Plan: 32 yo [id number] presenting at 37.0 wga in labor.  Gbs positive - amp given advanced labor.     Ranae Pila 11/08/2022, 8:57 AM

## 2022-11-08 NOTE — MAU Note (Signed)
32yo G3P2 presenting to MAU with contractions every 3 minutes since 0400. Denies vaginal bleeding or discharge, states she lost her mucus plug. Says that she does not feel baby moving as much as normal today, last time she felt normal movement was last night. Rating contractions 8/10. Was checked in the office on Tuesday, cervix was soft but closed.

## 2022-11-09 MED ORDER — DOCUSATE SODIUM 100 MG PO CAPS
100.0000 mg | ORAL_CAPSULE | Freq: Every day | ORAL | Status: DC
  Administered 2022-11-09 – 2022-11-10 (×2): 100 mg via ORAL
  Filled 2022-11-09 (×2): qty 1

## 2022-11-09 MED ORDER — FERROUS SULFATE 325 (65 FE) MG PO TABS
325.0000 mg | ORAL_TABLET | ORAL | Status: DC
  Administered 2022-11-09: 325 mg via ORAL
  Filled 2022-11-09: qty 1

## 2022-11-09 NOTE — Progress Notes (Signed)
CSW received a consult for MOB needing a carseat and met MOB at bedside. CSW was entering the room and was met by the nurse and was informed that MOB will be able to collect the $30 cost tomorrow (02-21-23). CSW introduced herself and just made sure the information given was correct; MOB said yes. CSW informed MOB to contact weekend SW once she was obtained the $30 cost and the carseat will be delivered at bedside.  CSW identifies no further need for intervention and no barriers to discharge at this time.   Enos Fling, Theresia Majors Clinical Social Worker [phone]

## 2022-11-09 NOTE — Anesthesia Postprocedure Evaluation (Signed)
Anesthesia Post Note  Patient: April Osborn  Procedure(s) Performed: AN AD HOC LABOR EPIDURAL     Anesthesia Type: Epidural Anesthetic complications: no  No notable events documented.  Last Vitals:  Vitals:   11/08/22 2354 11/09/22 0516  BP: 128/68 131/74  Pulse: 89 87  Resp: 16 16  Temp: 36.7 C 36.7 C  SpO2: 98% 98%    Last Pain:  Vitals:   11/09/22 0716  TempSrc:   PainSc: Asleep   Pain Goal:                   Cephus Shelling

## 2022-11-09 NOTE — Progress Notes (Signed)
Postpartum Progress Note  Post Partum Day 1 s/p spontaneous vaginal delivery.  Patient reports well-controlled pain, ambulating without difficulty, voiding spontaneously, tolerating PO.  Vaginal bleeding is appropriate.   Objective: Blood pressure 131/74, pulse 87, temperature 98.1 F (36.7 C), resp. rate 16, height 5\' 8"  (1.727 m), weight 105.6 kg, last menstrual period 02/05/2022, SpO2 98%, unknown if currently breastfeeding.  Physical Exam:  General: alert and no distress Lochia: appropriate Uterine Fundus: firm DVT Evaluation: No evidence of DVT seen on physical exam.  Recent Labs    11/08/22 0922 11/09/22 0647  HGB 11.4* 9.9*  HCT 36.6 31.2*    Assessment/Plan: Postpartum Day 1, s/p vaginal delivery. Continue routine postpartum care Baby boy - desires circ. Will perform if cleared by nursery.  Acute blood loss anemia, clinically significant - Fe/Colace. No signs or symptoms of anemia.  Anticipate discharge home PPD#1 or 2   LOS: 1 day   Lyn Henri 11/09/2022, 7:37 AM

## 2022-11-10 MED ORDER — IBUPROFEN 600 MG PO TABS: 600.0000 mg | ORAL_TABLET | Freq: Four times a day (QID) | ORAL | 0 refills | Status: DC

## 2022-11-10 MED ORDER — ACETAMINOPHEN 325 MG PO TABS: 650.0000 mg | ORAL_TABLET | ORAL | 0 refills | Status: DC | PRN

## 2022-11-10 NOTE — TOC Progression Note (Signed)
Transition of Care [facility]) - Progression Note    Patient Details  Name: DANEE SOLLER MRN: [id number] Date of Birth: 07/20/1990  Transition of Care [facility]) CM/SW Contact  Dannielle Karvonen Phone Number: 11/10/2022, 12:06 PM  Clinical Narrative:     CSW spoke with pt via phone concerning Edinburgh score of 13, pt polite and engaged in the conversation. Pt states she is currently on Zoloft and has been for the last three months or so, she states her dosage was recently increased and she feels it is working well. Pt states she is planning on starting therapy very soon, has not picked out a provider but has one in mind, declined needing any resources. Pt denies any thoughts to harm herself, her baby, or anyone else. Pt does have a safety plan in place. No other concerns voiced by pt. No barriers to dc.        Expected Discharge Plan and Services                                               Social Determinants of Health (SDOH) Interventions SDOH Screenings   Food Insecurity: No Food Insecurity (11/08/2022)  Housing: Low Risk  (11/08/2022)  Transportation Needs: No Transportation Needs (11/08/2022)  Utilities: Not At Risk (11/08/2022)  Tobacco Use: Low Risk  (11/08/2022)    Readmission Risk Interventions     No data to display

## 2022-11-10 NOTE — Lactation Note (Signed)
This note was copied from a baby's chart. Lactation Consultation Note  Patient Name: Boy Chellie Vanlue [id number] Date: 11/10/2022 Age:30 hours  Reason for consult: Initial assessment;Early term 37-38.6wks;Infant weight loss  P3, [redacted]w[redacted]d, 7% weight loss  Mother states baby is breastfeeding well. She reports her milk is coming in and she can hear swallowing when baby eats. Mother has supplemented with formula. This is mother's 3rd baby born between 44-[redacted] weeks gestation.   Mother requested a hand pump and coconut oil. Given both with instructions. Reviewed cleaning of equipment, milk collection and storage.  Mom made aware of O/P services, breastfeeding support groups, and our phone # for post-discharge questions.     Maternal Data Does the patient have breastfeeding experience prior to this delivery?: Yes How long did the patient breastfeed?: 3 months to 2 years  Feeding Mother's Current Feeding Choice: Breast Milk and Formula  LATCH Score  Not observed. Mother reports baby is latching well.   Interventions Interventions: Hand pump;Education;Coconut oil  Discharge Discharge Education: Engorgement and breast care;Warning signs for feeding baby Pump: DEBP;Manual;Personal  Consult Status Consult Status: Complete    Omar Person 11/10/2022, 9:12 AM

## 2022-11-10 NOTE — Progress Notes (Signed)
Postpartum Progress Note  Post Partum Day 2 s/p spontaneous vaginal delivery.  Patient reports well-controlled pain, ambulating without difficulty, voiding spontaneously, tolerating PO.  Vaginal bleeding is appropriate.   Objective: Blood pressure 131/78, pulse 91, temperature 98.7 F (37.1 C), temperature source Oral, resp. rate 16, height 5\' 8"  (1.727 m), weight 105.6 kg, last menstrual period 02/05/2022, SpO2 97%, unknown if currently breastfeeding.  Physical Exam:  General: alert and no distress Lochia: appropriate Uterine Fundus: firm DVT Evaluation: No evidence of DVT seen on physical exam.  Recent Labs    11/08/22 0922 11/09/22 0647  HGB 11.4* 9.9*  HCT 36.6 31.2*    Assessment/Plan: Postpartum Day 2, s/p vaginal delivery. Continue routine postpartum care Baby boy - desires circ. Will perform if cleared by nursery.  Acute blood loss anemia, clinically significant - Fe/Colace. No signs or symptoms of anemia.  Anticipate discharge home today   LOS: 2 days   Lyn Henri 11/10/2022, 6:47 AM

## 2022-11-11 NOTE — Discharge Summary (Signed)
Obstetric Discharge Summary  April Osborn is a 32 y.o. female that presented on 11/08/2022 for labor. .  Her labor course was uncomplicated and she delivered a viable female infant on 11/08/2022. Baby stayed for inadequate GBS coverage due to fast labor. Her postpartum course was uncomplicated and on PPD#2, she reported well controlled pain, spontaneous voiding, ambulating without difficulty, and tolerating PO.  She was stable for discharge home on 11/10/2022 with plans for in-office follow up.  Hemoglobin  Date Value Ref Range Status  11/09/2022 9.9 (L) 12.0 - 15.0 g/dL Final   HCT  Date Value Ref Range Status  11/09/2022 31.2 (L) 36.0 - 46.0 % Final    Physical Exam:  General: alert and no distress Lochia: appropriate Uterine Fundus: firm DVT Evaluation: No evidence of DVT seen on physical exam.  Discharge Diagnoses: Term Pregnancy-delivered  Discharge Information: Date: 11/10/2022 Activity: Pelvic rest, as tolerated Diet: routine Medications: Tylenol, motrin Condition: stable Instructions: Refer to practice specific booklet.  Discussed prior to discharge.  Discharge to: Home  Follow-up Information     [place] Follow up.   Why: Please follow up for a 6 week postpartum visit. Contact information: [address] [place] [address] [phone]                 Newborn Data: Live born female  Birth Weight: 6 lb 15.8 oz (3170 g) APGAR: 9, 9  Newborn Delivery   Birth date/time: 11/08/2022 11:59:00 Delivery type: Vaginal, Spontaneous     Home with mother.  Lyn Henri 11/11/2022, 10:47 AM

## 2022-11-13 ENCOUNTER — Telehealth (HOSPITAL_COMMUNITY): Payer: Self-pay | Admitting: *Deleted

## 2022-11-13 NOTE — Telephone Encounter (Signed)
Hospital inpatient EPDS=13. Answered "hardly ever" to question 10.  CSW consult completed during stay. EPDS score faxed to Dr. Elon Spanner for review. Paulene Floor, RN, 11/13/22, 2022

## 2022-11-16 ENCOUNTER — Ambulatory Visit: Payer: Federal, State, Local not specified - PPO | Admitting: Cardiology

## 2022-11-22 ENCOUNTER — Telehealth: Payer: Federal, State, Local not specified - PPO | Admitting: Physician Assistant

## 2022-11-22 DIAGNOSIS — R3989 Other symptoms and signs involving the genitourinary system: Secondary | ICD-10-CM

## 2022-11-22 MED ORDER — CEPHALEXIN 500 MG PO CAPS
500.0000 mg | ORAL_CAPSULE | Freq: Two times a day (BID) | ORAL | 0 refills | Status: DC
Start: 2022-11-22 — End: 2022-12-24

## 2022-11-22 NOTE — Progress Notes (Signed)
E-Visit for Urinary Problems  We are sorry that you are not feeling well.  Here is how we plan to help!  Based on what you shared with me it looks like you most likely have a simple urinary tract infection.  A UTI (Urinary Tract Infection) is a bacterial infection of the bladder.  Most cases of urinary tract infections are simple to treat but a key part of your care is to encourage you to drink plenty of fluids and watch your symptoms carefully.  I have prescribed Keflex 500 mg twice a day for 7 days, safe in breastfeeding.  Your symptoms should gradually improve. Call us if the burning in your urine worsens, you develop worsening fever, back pain or pelvic pain or if your symptoms do not resolve after completing the antibiotic.  Urinary tract infections can be prevented by drinking plenty of water to keep your body hydrated.  Also be sure when you wipe, wipe from front to back and don't hold it in!  If possible, empty your bladder every 4 hours.  HOME CARE Drink plenty of fluids Compete the full course of the antibiotics even if the symptoms resolve Remember, when you need to go.go. Holding in your urine can increase the likelihood of getting a UTI! GET HELP RIGHT AWAY IF: You cannot urinate You get a high fever Worsening back pain occurs You see blood in your urine You feel sick to your stomach or throw up You feel like you are going to pass out  MAKE SURE YOU  Understand these instructions. Will watch your condition. Will get help right away if you are not doing well or get worse.   Thank you for choosing an e-visit.  Your e-visit answers were reviewed by a board certified advanced clinical practitioner to complete your personal care plan. Depending upon the condition, your plan could have included both over the counter or prescription medications.  Please review your pharmacy choice. Make sure the pharmacy is open so you can pick up prescription now. If there is a problem, you  may contact your provider through [employer] and have the prescription routed to another pharmacy.  Your safety is important to [place]. If you have drug allergies check your prescription carefully.   For the next 24 hours you can use MyChart to ask questions about today's visit, request a non-urgent call back, or ask for a work or school excuse. You will get an email in the next two days asking about your experience. I hope that your e-visit has been valuable and will speed your recovery.   I have spent 5 minutes in review of e-visit questionnaire, review and updating patient chart, medical decision making and response to patient.   Margaretann Loveless, PA-C

## 2022-12-04 ENCOUNTER — Telehealth (HOSPITAL_COMMUNITY): Payer: Self-pay | Admitting: *Deleted

## 2022-12-04 NOTE — Telephone Encounter (Signed)
12/04/2022  Name: LEDDY CARDIFF MRN: [id number] DOB: Jan 16, 1991  Reason for Call:  Transition of Care Hospital Discharge Call  Contact Status: Patient Contact Status: Message  Language assistant needed: Interpreter Mode: Interpreter Not Needed        Follow-Up Questions:    Inocente Salles Postnatal Depression Scale:  In the Past 7 Days:    PHQ2-9 Depression Scale:     Discharge Follow-up:    Post-discharge interventions: NA  Salena Saner, RN 12/04/2022 13:29

## 2022-12-24 ENCOUNTER — Telehealth: Payer: Federal, State, Local not specified - PPO | Admitting: Physician Assistant

## 2022-12-24 DIAGNOSIS — J069 Acute upper respiratory infection, unspecified: Secondary | ICD-10-CM | POA: Diagnosis not present

## 2022-12-24 MED ORDER — FLUTICASONE PROPIONATE 50 MCG/ACT NA SUSP: 2.0000 | Freq: Every day | NASAL | 0 refills | Status: DC

## 2022-12-24 NOTE — Progress Notes (Signed)
E-Visit for Upper Respiratory Infection   We are sorry you are not feeling well.  Here is how we plan to help!  Based on what you have shared with me, it looks like you may have a viral upper respiratory infection.  Upper respiratory infections are caused by a large number of viruses; however, rhinovirus is the most common cause.   Symptoms vary from person to person, with common symptoms including sore throat, cough, fatigue or lack of energy and feeling of general discomfort.  A low-grade fever of up to 100.4 may present, but is often uncommon.  Symptoms vary however, and are closely related to a person's age or underlying illnesses.  The most common symptoms associated with an upper respiratory infection are nasal discharge or congestion, cough, sneezing, headache and pressure in the ears and face.  These symptoms usually persist for about 3 to 10 days, but can last up to 2 weeks.  It is important to know that upper respiratory infections do not cause serious illness or complications in most cases.    Upper respiratory infections can be transmitted from person to person, with the most common method of transmission being a person's hands.  The virus is able to live on the skin and can infect other persons for up to 2 hours after direct contact.  Also, these can be transmitted when someone coughs or sneezes; thus, it is important to cover the mouth to reduce this risk.  To keep the spread of the illness at bay, good hand hygiene is very important.  This is an infection that is most likely caused by a virus. There are no specific treatments other than to help you with the symptoms until the infection runs its course.  We are sorry you are not feeling well.  Here is how we plan to help!   For nasal congestion, you may use an oral decongestants such as Mucinex D or if you have glaucoma or high blood pressure use plain Mucinex.  Saline nasal spray or nasal drops can help and can safely be used as often as  needed for congestion.  For your congestion, I have prescribed Fluticasone nasal spray one spray in each nostril twice a day  If you do not have a history of heart disease, hypertension, diabetes or thyroid disease, prostate/bladder issues or glaucoma, you may also use Sudafed to treat nasal congestion.  It is highly recommended that you consult with a pharmacist or your primary care physician to ensure this medication is safe for you to take.     If you have a cough, you may use cough suppressants such as Delsym and Robitussin.  If you have glaucoma or high blood pressure, you can also use Coricidin HBP.    If you have a sore or scratchy throat, use a saltwater gargle-  to  teaspoon of salt dissolved in a 4-ounce to 8-ounce glass of warm water.  Gargle the solution for approximately 15-30 seconds and then spit.  It is important not to swallow the solution.  You can also use throat lozenges/cough drops and Chloraseptic spray to help with throat pain or discomfort.  Warm or cold liquids can also be helpful in relieving throat pain.  For headache, pain or general discomfort, you can use Ibuprofen or Tylenol as directed.   Some authorities believe that zinc sprays or the use of Echinacea may shorten the course of your symptoms.   HOME CARE Only take medications as instructed by your medical team.   Be sure to drink plenty of fluids. Water is fine as well as fruit juices, sodas and electrolyte beverages. You may want to stay away from caffeine or alcohol. If you are nauseated, try taking small sips of liquids. How do you know if you are getting enough fluid? Your urine should be a pale yellow or almost colorless. Get rest. Taking a steamy shower or using a humidifier may help nasal congestion and ease sore throat pain. You can place a towel over your head and breathe in the steam from hot water coming from a faucet. Using a saline nasal spray works much the same way. Cough drops, hard candies and sore  throat lozenges may ease your cough. Avoid close contacts especially the very young and the elderly Cover your mouth if you cough or sneeze Always remember to wash your hands.   GET HELP RIGHT AWAY IF: You develop worsening fever. If your symptoms do not improve within 10 days You develop yellow or green discharge from your nose over 3 days. You have coughing fits You develop a severe head ache or visual changes. You develop shortness of breath, difficulty breathing or start having chest pain Your symptoms persist after you have completed your treatment plan  MAKE SURE YOU  Understand these instructions. Will watch your condition. Will get help right away if you are not doing well or get worse.  Thank you for choosing an e-visit.  Your e-visit answers were reviewed by a board certified advanced clinical practitioner to complete your personal care plan. Depending upon the condition, your plan could have included both over the counter or prescription medications.  Please review your pharmacy choice. Make sure the pharmacy is open so you can pick up prescription now. If there is a problem, you may contact your provider through MyChart messaging and have the prescription routed to another pharmacy.  Your safety is important to us. If you have drug allergies check your prescription carefully.   For the next 24 hours you can use MyChart to ask questions about today's visit, request a non-urgent call back, or ask for a work or school excuse. You will get an email in the next two days asking about your experience. I hope that your e-visit has been valuable and will speed your recovery.  I have spent 5 minutes in review of e-visit questionnaire, review and updating patient chart, medical decision making and response to patient.   Elfa Wooton M Estus Krakowski, PA-C  

## 2022-12-30 ENCOUNTER — Telehealth: Payer: Federal, State, Local not specified - PPO | Admitting: Family

## 2022-12-30 DIAGNOSIS — R399 Unspecified symptoms and signs involving the genitourinary system: Secondary | ICD-10-CM

## 2022-12-30 MED ORDER — CEPHALEXIN 500 MG PO CAPS: 500.0000 mg | ORAL_CAPSULE | Freq: Two times a day (BID) | ORAL | 0 refills | Status: DC

## 2022-12-30 NOTE — Progress Notes (Signed)

## 2023-02-08 ENCOUNTER — Telehealth: Payer: Federal, State, Local not specified - PPO | Admitting: Physician Assistant

## 2023-02-08 DIAGNOSIS — R6889 Other general symptoms and signs: Secondary | ICD-10-CM | POA: Diagnosis not present

## 2023-02-08 NOTE — Progress Notes (Signed)
E visit for Flu like symptoms   We are sorry that you are not feeling well.  Here is how we plan to help! Based on what you have shared with me it looks like you may have a respiratory virus that may be influenza.  Influenza or "the flu" is   an infection caused by a respiratory virus. The flu virus is highly contagious and persons who did not receive their yearly flu vaccination may "catch" the flu from close contact.  We have anti-viral medications to treat the viruses that cause this infection. They are not a "cure" and only shorten the course of the infection. These prescriptions are most effective when they are given within the first 2 days of "flu" symptoms. Antiviral medication are indicated if you have a high risk of complications from the flu. You should  also consider an antiviral medication if you are in close contact with someone who is at risk. These medications can help patients avoid complications from the flu  but have side effects that you should know. Possible side effects from Tamiflu or oseltamivir include nausea, vomiting, diarrhea, dizziness, headaches, eye redness, sleep problems or other respiratory symptoms. You should not take Tamiflu if you have an allergy to oseltamivir or any to the ingredients in Tamiflu.  Based upon your symptoms and potential risk factors I have prescribed Oseltamivir (Tamiflu).  It has been sent to your designated pharmacy.  You will take one 75 mg capsule orally twice a day for the next 5 days. Tamiflu is safe in breastfeeding.   ANYONE WHO HAS FLU SYMPTOMS SHOULD: Stay home. The flu is highly contagious and going out or to work exposes others! Be sure to drink plenty of fluids. Water is fine as well as fruit juices, sodas and electrolyte beverages. You may want to stay away from caffeine or alcohol. If you are nauseated, try taking small sips of liquids. How do you know if you are getting enough fluid? Your urine should be a pale yellow or almost  colorless. Get rest. Taking a steamy shower or using a humidifier may help nasal congestion and ease sore throat pain. Using a saline nasal spray works much the same way. Cough drops, hard candies and sore throat lozenges may ease your cough. Line up a caregiver. Have someone check on you regularly.   GET HELP RIGHT AWAY IF: You cannot keep down liquids or your medications. You become short of breath Your fell like you are going to pass out or loose consciousness. Your symptoms persist after you have completed your treatment plan MAKE SURE YOU  Understand these instructions. Will watch your condition. Will get help right away if you are not doing well or get worse.  Your e-visit answers were reviewed by a board certified advanced clinical practitioner to complete your personal care plan.  Depending on the condition, your plan could have included both over the counter or prescription medications.  If there is a problem please reply  once you have received a response from your provider.  Your safety is important to [place].  If you have drug allergies check your prescription carefully.    You can use MyChart to ask questions about today's visit, request a non-urgent call back, or ask for a work or school excuse for 24 hours related to this e-Visit. If it has been greater than 24 hours you will need to follow up with your provider, or enter a new e-Visit to address those concerns.  You will  get an e-mail in the next two days asking about your experience.  I hope that your e-visit has been valuable and will speed your recovery. Thank you for using e-visits.   I have spent 5 minutes in review of e-visit questionnaire, review and updating patient chart, medical decision making and response to patient.   Margaretann Loveless, PA-C

## 2023-02-21 ENCOUNTER — Telehealth: Payer: Federal, State, Local not specified - PPO | Admitting: Physician Assistant

## 2023-02-21 DIAGNOSIS — N390 Urinary tract infection, site not specified: Secondary | ICD-10-CM

## 2023-02-21 DIAGNOSIS — R079 Chest pain, unspecified: Secondary | ICD-10-CM

## 2023-02-21 NOTE — Progress Notes (Signed)
Because you have already been recommended to be evaluated in person for reoccurring urinary symptoms and with these upper respiratory symptoms you are noting chest pain, we want you to be evaluated in person this morning so you can get fully checked out.   NOTE: There will be NO CHARGE for this eVisit   If you are having a true medical emergency please call 911.      For an urgent face to face visit, [place] has eight urgent care centers for your convenience:   NEW!! [facility] Health Urgent Care Center at [facility] Get Driving Directions [phone] [address] [place], [address]    [facility] Health Urgent Care Center at [facility] Get Driving Directions [phone] [address] [place], [place] [address]   [facility] Health Urgent Care Center [facility]) Get Driving Directions [phone] [address] [place], [place] [address]  [facility] Health Urgent Care Center [facility]) Get Driving Directions [phone] [address] [place],  [place]  [address]  [facility] Health Urgent Care Center [facility] - at [employer]  [phone] [address] [place],  [place] [address]   [facility] Health Urgent Care at [facility] Get Driving Directions [phone] [address] [place], [place] [address]   [facility] Health Urgent Care at [facility] Get Driving Directions  [phone] [address] [place], [place] [address]   [facility] Health Urgent Care at [facility] Directions [phone] [address] [place], [place] [address]  Your MyChart E-visit questionnaire answers were reviewed by a board certified advanced clinical practitioner to complete your personal care plan based on your specific symptoms.  Thank you for using e-Visits.

## 2023-02-21 NOTE — Progress Notes (Signed)
Because of recurring urinary symptoms and need for urine culture, I feel your condition warrants further evaluation and I recommend that you be seen in a face to face visit.   NOTE: There will be NO CHARGE for this eVisit   If you are having a true medical emergency please call 911.      For an urgent face to face visit, [place] has eight urgent care centers for your convenience:   NEW!! [facility] Health Urgent Care Center at [facility] Get Driving Directions [phone] [address] [place], [address]    [facility] Health Urgent Care Center at [facility] Get Driving Directions [phone] [address] [place], [place] [address]   [facility] Health Urgent Care Center [facility]) Get Driving Directions [phone] [address] [place], [place] [address]  [facility] Health Urgent Care Center [facility]) Get Driving Directions [phone] [address] [place],  [place]  [address]  [facility] Health Urgent Care Center [facility] - at [employer]  [phone] [address] [place],  [place] [address]   [facility] Health Urgent Care at [facility] Get Driving Directions [phone] [address] [place], [place] [address]   [facility] Health Urgent Care at [facility] Get Driving Directions  [phone] [address] [place], [place] [address]   [facility] Health Urgent Care at [facility] Directions [phone] [address], [address] [place], [place] [address]  Your MyChart E-visit questionnaire answers were reviewed by a board certified advanced clinical practitioner to complete your personal care plan based on your specific symptoms.  Thank you for using e-Visits.

## 2023-02-25 ENCOUNTER — Telehealth: Payer: Federal, State, Local not specified - PPO | Admitting: Physician Assistant

## 2023-02-25 DIAGNOSIS — N390 Urinary tract infection, site not specified: Secondary | ICD-10-CM

## 2023-02-25 NOTE — Progress Notes (Signed)
Because you mention developing resistance to antibiotics and having recurring symptoms, I feel your condition warrants further evaluation and I recommend that you be seen in a face to face visit. When there is potential to have resistant infections we prefer to have cultures completed to see if there is resistant bacteria causing the infection to be able to treat appropriately.   NOTE: There will be NO CHARGE for this eVisit   If you are having a true medical emergency please call 911.      For an urgent face to face visit, [place] has eight urgent care centers for your convenience:   NEW!! [facility] Health Urgent Care Center at [facility] Get Driving Directions [phone] [address] [place], [address]    [facility] Health Urgent Care Center at [facility] Get Driving Directions [phone] [address] [place], [place] [address]   [facility] Health Urgent Care Center [facility]) Get Driving Directions [phone] [address] [place], [place] [address]  [facility] Health Urgent Care Center [facility]) Get Driving Directions [phone] [address] [place],  [place]  [address]  [facility]gent Care Center [facility] - at [employer]  [phone] [address] [place],  [place] [address]   [facility] Health Urgent Care at [facility] Get Driving Directions [phone] [address] [place], [place] [address]   [facility] Health Urgent Care at [facility] Get Driving Directions  [phone] [address] [place], [place] [address]   [facility] Health Urgent Care at [facility] Directions [phone] [address], [address] [place], [place] [address]  Your MyChart E-visit questionnaire answers were reviewed by a board certified advanced clinical practitioner to complete your personal care plan based on your specific symptoms.   Thank you for using e-Visits.    I have spent 5 minutes in review of e-visit questionnaire, review and updating patient chart, medical decision making and response to patient.   Margaretann Loveless, PA-C

## 2023-02-27 ENCOUNTER — Telehealth: Payer: Federal, State, Local not specified - PPO | Admitting: Physician Assistant

## 2023-02-27 DIAGNOSIS — H109 Unspecified conjunctivitis: Secondary | ICD-10-CM | POA: Diagnosis not present

## 2023-02-27 MED ORDER — POLYMYXIN B-TRIMETHOPRIM 10000-0.1 UNIT/ML-% OP SOLN: 1.0000 [drp] | Freq: Four times a day (QID) | OPHTHALMIC | 0 refills | Status: DC

## 2023-02-27 NOTE — Progress Notes (Signed)
E-Visit for Pink Eye   We are sorry that you are not feeling well.  Here is how we plan to help!  Based on what you have shared with me it looks like you have conjunctivitis.  Conjunctivitis is a common inflammatory or infectious condition of the eye that is often referred to as "pink eye".  In most cases it is contagious (viral or bacterial). However, not all conjunctivitis requires antibiotics (ex. Allergic).  We have made appropriate suggestions for you based upon your presentation.  I have prescribed Polytrim Ophthalmic drops 1-2 drops 4 times a day times 5 days  Pink eye can be highly contagious.  It is typically spread through direct contact with secretions, or contaminated objects or surfaces that one may have touched.  Strict handwashing is suggested with soap and water is urged.  If not available, use alcohol based had sanitizer.  Avoid unnecessary touching of the eye.  If you wear contact lenses, you will need to refrain from wearing them until you see no white discharge from the eye for at least 24 hours after being on medication.  You should see symptom improvement in 1-2 days after starting the medication regimen.  Call us if symptoms are not improved in 1-2 days.  Home Care: Wash your hands often! Do not wear your contacts until you complete your treatment plan. Avoid sharing towels, bed linen, personal items with a person who has pink eye. See attention for anyone in your home with similar symptoms.  Get Help Right Away If: Your symptoms do not improve. You develop blurred or loss of vision. Your symptoms worsen (increased discharge, pain or redness)   Thank you for choosing an e-visit.  Your e-visit answers were reviewed by a board certified advanced clinical practitioner to complete your personal care plan. Depending upon the condition, your plan could have included both over the counter or prescription medications.  Please review your pharmacy choice. Make sure the  pharmacy is open so you can pick up prescription now. If there is a problem, you may contact your provider through MyChart messaging and have the prescription routed to another pharmacy.  Your safety is important to us. If you have drug allergies check your prescription carefully.   For the next 24 hours you can use MyChart to ask questions about today's visit, request a non-urgent call back, or ask for a work or school excuse. You will get an email in the next two days asking about your experience. I hope that your e-visit has been valuable and will speed your recovery.  I have spent 5 minutes in review of e-visit questionnaire, review and updating patient chart, medical decision making and response to patient.   Wilkes Potvin M Hennessey Cantrell, PA-C  

## 2023-03-23 ENCOUNTER — Telehealth: Payer: Federal, State, Local not specified - PPO | Admitting: Physician Assistant

## 2023-03-23 DIAGNOSIS — J029 Acute pharyngitis, unspecified: Secondary | ICD-10-CM | POA: Diagnosis not present

## 2023-03-23 DIAGNOSIS — M791 Myalgia, unspecified site: Secondary | ICD-10-CM

## 2023-03-23 NOTE — Progress Notes (Signed)
E visit for Flu like symptoms   We are sorry that you are not feeling well.  Here is how we plan to help! Based on what you have shared with me it looks like you may have a respiratory virus that may be influenza.  Influenza or "the flu" is   an infection caused by a respiratory virus. The flu virus is highly contagious and persons who did not receive their yearly flu vaccination may "catch" the flu from close contact.  We have anti-viral medications to treat the viruses that cause this infection. They are not a "cure" and only shorten the course of the infection. These prescriptions are most effective when they are given within the first 2 days of "flu" symptoms. Antiviral medication are indicated if you have a high risk of complications from the flu. You should  also consider an antiviral medication if you are in close contact with someone who is at risk. These medications can help patients avoid complications from the flu  but have side effects that you should know. Possible side effects from Tamiflu or oseltamivir include nausea, vomiting, diarrhea, dizziness, headaches, eye redness, sleep problems or other respiratory symptoms. You should not take Tamiflu if you have an allergy to oseltamivir or any to the ingredients in Tamiflu.  Based upon your symptoms and potential risk factors I recommend that you follow the flu symptoms recommendation that I have listed below.  ANYONE WHO HAS FLU SYMPTOMS SHOULD: Stay home. The flu is highly contagious and going out or to work exposes others! Be sure to drink plenty of fluids. Water is fine as well as fruit juices, sodas and electrolyte beverages. You may want to stay away from caffeine or alcohol. If you are nauseated, try taking small sips of liquids. How do you know if you are getting enough fluid? Your urine should be a pale yellow or almost colorless. Get rest. Taking a steamy shower or using a humidifier may help nasal congestion and ease sore throat  pain. Using a saline nasal spray works much the same way. Cough drops, hard candies and sore throat lozenges may ease your cough. Line up a caregiver. Have someone check on you regularly.   GET HELP RIGHT AWAY IF: You cannot keep down liquids or your medications. You become short of breath Your fell like you are going to pass out or loose consciousness. Your symptoms persist after you have completed your treatment plan MAKE SURE YOU  Understand these instructions. Will watch your condition. Will get help right away if you are not doing well or get worse.  Your e-visit answers were reviewed by a board certified advanced clinical practitioner to complete your personal care plan.  Depending on the condition, your plan could have included both over the counter or prescription medications.  If there is a problem please reply  once you have received a response from your provider.  Your safety is important to [place].  If you have drug allergies check your prescription carefully.    You can use MyChart to ask questions about today's visit, request a non-urgent call back, or ask for a work or school excuse for 24 hours related to this e-Visit. If it has been greater than 24 hours you will need to follow up with your provider, or enter a new e-Visit to address those concerns.  You will get an e-mail in the next two days asking about your experience.  I hope that your e-visit has been valuable and will  speed your recovery. Thank you for using e-visits.   I have spent 5 minutes in review of e-visit questionnaire, review and updating patient chart, medical decision making and response to patient.   Lenise Arena Ward, PA-C

## 2023-03-25 ENCOUNTER — Encounter: Payer: Self-pay | Admitting: Family Medicine

## 2023-04-29 ENCOUNTER — Telehealth: Payer: BC Managed Care – PPO | Admitting: Physician Assistant

## 2023-04-29 DIAGNOSIS — H53142 Visual discomfort, left eye: Secondary | ICD-10-CM

## 2023-04-29 DIAGNOSIS — H5789 Other specified disorders of eye and adnexa: Secondary | ICD-10-CM | POA: Diagnosis not present

## 2023-04-29 MED ORDER — NEOMYCIN-POLYMYXIN-HC 3.5-10000-1 OP SUSP
2.0000 [drp] | OPHTHALMIC | 0 refills | Status: DC
Start: 2023-04-29 — End: 2023-05-22

## 2023-04-29 NOTE — Progress Notes (Signed)
  E-Visit for [employer]   We are sorry that you are not feeling well. Here is how we plan to help!  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  We have made appropriate suggestions for you based upon your presentation:  Your symptoms may indicate an infection of the sclera.  The use of anti-inflammatory and antibiotic eye drops for a week will help resolve this condition.  I have sent in neomycin-polymyxin HC opthalmic suspension, two to three drops in the affected eye every 4 hours.  If your symptoms do not improve over the next two to three days you should be seen in your doctor's office.  HOME CARE:  Wash your hands often! Let the stye open on its own. Don't squeeze or open it. Don't rub your eyes. This can irritate your eyes and let in bacteria.  If you need to touch your eyes, wash your hands first. Don't wear eye makeup or contact lenses until the area has healed.  GET HELP RIGHT AWAY IF:  Your symptoms do not improve. You develop blurred or loss of vision. Your symptoms worsen (increased discharge, pain or redness).   Thank you for choosing an e-visit.  Your e-visit answers were reviewed by a board certified advanced clinical practitioner to complete your personal care plan. Depending upon the condition, your plan could have included both over the counter or prescription medications.  Please review your pharmacy choice. Make sure the pharmacy is open so you can pick up prescription now. If there is a problem, you may contact your provider through [employer] and have the prescription routed to another pharmacy.  Your safety is important to [place]. If you have drug allergies check your prescription carefully.   For the next 24 hours you can use MyChart to ask questions about today's visit, request a non-urgent call back, or ask for a work or school excuse. You will get an email in the next two days asking about your experience. I hope that your  e-visit has been valuable and will speed your recovery.     I have spent 5 minutes in review of e-visit questionnaire, review and updating patient chart, medical decision making and response to patient.   Margaretann Loveless, PA-C

## 2023-04-30 MED ORDER — NEOMYCIN-POLYMYXIN-DEXAMETH 0.1 % OP SUSP
1.0000 [drp] | Freq: Four times a day (QID) | OPHTHALMIC | 0 refills | Status: DC
Start: 2023-04-30 — End: 2023-05-22

## 2023-04-30 NOTE — Addendum Note (Signed)
Addended by: Margaretann Loveless on: 04/30/2023 02:55 PM   Modules accepted: Orders

## 2023-05-21 ENCOUNTER — Telehealth: Payer: BC Managed Care – PPO

## 2023-05-21 DIAGNOSIS — B9689 Other specified bacterial agents as the cause of diseases classified elsewhere: Secondary | ICD-10-CM

## 2023-05-21 DIAGNOSIS — B3731 Acute candidiasis of vulva and vagina: Secondary | ICD-10-CM

## 2023-05-21 DIAGNOSIS — R3989 Other symptoms and signs involving the genitourinary system: Secondary | ICD-10-CM | POA: Diagnosis not present

## 2023-05-21 DIAGNOSIS — N760 Acute vaginitis: Secondary | ICD-10-CM

## 2023-05-22 MED ORDER — CEPHALEXIN 500 MG PO CAPS: 500.0000 mg | ORAL_CAPSULE | Freq: Two times a day (BID) | ORAL | 0 refills | Status: AC

## 2023-05-22 MED ORDER — METRONIDAZOLE 500 MG PO TABS
500.0000 mg | ORAL_TABLET | Freq: Two times a day (BID) | ORAL | 0 refills | Status: AC
Start: 2023-05-22 — End: 2023-05-29

## 2023-05-22 NOTE — Progress Notes (Signed)
I have spent 5 minutes in review of e-visit questionnaire, review and updating patient chart, medical decision making and response to patient.   Piedad Climes, PA-C

## 2023-05-22 NOTE — Progress Notes (Signed)
Duplicate encounter.  Disregard.

## 2023-05-22 NOTE — Addendum Note (Signed)
Addended by: Margaretann Loveless on: 05/22/2023 07:22 AM   Modules accepted: Orders

## 2023-05-22 NOTE — Progress Notes (Signed)
E-Visit for Urinary Problems  We are sorry that you are not feeling well.  Here is how we plan to help!  Based on what you shared with me it looks like you most likely have a simple urinary tract infection.  A UTI (Urinary Tract Infection) is a bacterial infection of the bladder.  Most cases of urinary tract infections are simple to treat but a key part of your care is to encourage you to drink plenty of fluids and watch your symptoms carefully.  I have prescribed Keflex 500 mg twice a day for 7 days.  Your symptoms should gradually improve. Call us if the burning in your urine worsens, you develop worsening fever, back pain or pelvic pain or if your symptoms do not resolve after completing the antibiotic.  For suspected yeast infection, since you are breastfeeding we recommend you first use OTC monitstat (clotrimazole) or gyne lotrimin as they are topical medications and less risk to baby while still being effective treatments. If this is not quickly clearing up symptoms, let us know.   Urinary tract infections can be prevented by drinking plenty of water to keep your body hydrated.  Also be sure when you wipe, wipe from front to back and don't hold it in!  If possible, empty your bladder every 4 hours.  HOME CARE Drink plenty of fluids Compete the full course of the antibiotics even if the symptoms resolve Remember, when you need to go.go. Holding in your urine can increase the likelihood of getting a UTI! GET HELP RIGHT AWAY IF: You cannot urinate You get a high fever Worsening back pain occurs You see blood in your urine You feel sick to your stomach or throw up You feel like you are going to pass out  MAKE SURE YOU  Understand these instructions. Will watch your condition. Will get help right away if you are not doing well or get worse.   Thank you for choosing an e-visit.  Your e-visit answers were reviewed by a board certified advanced clinical practitioner to complete your  personal care plan. Depending upon the condition, your plan could have included both over the counter or prescription medications.  Please review your pharmacy choice. Make sure the pharmacy is open so you can pick up prescription now. If there is a problem, you may contact your provider through [employer] and have the prescription routed to another pharmacy.  Your safety is important to [place]. If you have drug allergies check your prescription carefully.   For the next 24 hours you can use MyChart to ask questions about today's visit, request a non-urgent call back, or ask for a work or school excuse. You will get an email in the next two days asking about your experience. I hope that your e-visit has been valuable and will speed your recovery.

## 2023-06-08 ENCOUNTER — Encounter

## 2023-06-08 ENCOUNTER — Telehealth: Admitting: Family Medicine

## 2023-06-08 DIAGNOSIS — B370 Candidal stomatitis: Secondary | ICD-10-CM | POA: Diagnosis not present

## 2023-06-08 MED ORDER — NYSTATIN 100000 UNIT/ML MT SUSP
5.0000 mL | Freq: Four times a day (QID) | OROMUCOSAL | 0 refills | Status: AC
Start: 2023-06-08 — End: 2023-06-15

## 2023-06-08 NOTE — Progress Notes (Signed)
 We are sorry that you are not feeling well.  Here is how we plan to help!   Based on what you shared with me it looks like you most likely have an oral thrush infection.  I have prescribed Nystatin Oral Solution for 7 days. You will swish, hold and swallow the solution.  Your symptoms should improve.  If they do not, please follow up in person with your PCP or your nearest urgent care.  Oral Thrush, Adult Oral thrush is an infection in your mouth and throat and on your tongue. It causes white patches to form in your mouth and on your tongue. Many cases of thrush are mild. But, sometimes, thrush can be serious. People who have a weak body defense system (immune system) or other diseases can be affected more. What are the causes? This condition is caused by a type of fungus called yeast. The fungus is normally present in small amounts in the mouth and nose. If a person has a long-term illness or a weak body defense system, the fungus can grow and spread quickly. This causes thrush. What increases the risk? You are more likely to develop this condition if: You have a weak body defense system. You are an older adult. You have diabetes, cancer, or HIV. You have a dry mouth. You are pregnant or breastfeeding. You do not take good care of your teeth. This risk is greater for people who have false teeth (dentures). You use antibiotic or steroid medicines. What are the signs or symptoms? Symptoms of this condition include: A burning feeling in the mouth and throat. White patches that stick to the mouth and tongue. A bad taste in the mouth or trouble tasting foods. A feeling like you have cotton in your mouth. Pain when you eat and swallow. Not wanting to eat as much as usual. Cracking at the corners of the mouth. How is this treated? This condition is treated with medicines called antifungals. These medicines prevent a fungus from growing. The medicines are either put right on the area (topical)  or swallowed (oral). Your doctor will also treat other problems that you may have, such as diabetes or HIV. Follow these instructions at home: Helping with pain and soreness To lessen your pain: Drink cold liquids, like water and iced tea. Eat frozen ice pops or frozen juices. Eat foods that are easy to swallow, like gelatin and ice cream. Drink from a straw if you have too much pain in your mouth.  General instructions Take or use over-the-counter and prescription medicines only as told by your doctor. Eat plain yogurt that has live cultures in it. Read the label to make sure that there are live cultures in your yogurt. If you wear false teeth: Take them out before you go to bed. Brush them well. Soak them in a cleaner. Rinse your mouth with warm salt-water many times a day. To make the salt-water mixture, dissolve -1 teaspoon (3-6 g) of salt in 1 cup (237 mL) of warm water. Contact a doctor if: Your problems do not get better within 7 days of treatment. Your infection is spreading. This may show as white areas on the skin outside of your mouth. You are nursing your baby and you have redness and pain in the nipples. Summary Oral thrush is an infection in your mouth and throat. It is caused by a fungus. You are more likely to get this condition if you have a weak body defense system. Diseases like diabetes, cancer,  or HIV also add to your risk. This condition is treated with medicines called antifungals. Contact a doctor if you do not get better within 7 days of starting treatment. This information is not intended to replace advice given to you by your health care provider. Make sure you discuss any questions you have with your health care provider.  I have spent 5 minutes in review of e-visit questionnaire, review and updating patient chart, medical decision making and response to patient.   Reed Pandy, PA-C

## 2023-06-13 ENCOUNTER — Encounter: Payer: Self-pay | Admitting: Family Medicine

## 2023-06-13 ENCOUNTER — Other Ambulatory Visit: Payer: Self-pay | Admitting: Physician Assistant

## 2023-06-13 DIAGNOSIS — S83411A Sprain of medial collateral ligament of right knee, initial encounter: Secondary | ICD-10-CM

## 2023-07-02 ENCOUNTER — Other Ambulatory Visit

## 2023-07-02 ENCOUNTER — Ambulatory Visit
Admission: RE | Admit: 2023-07-02 | Discharge: 2023-07-02 | Disposition: A | Discharge status: Home / Self Care | Source: Ambulatory Visit | Attending: Family Medicine | Admitting: Family Medicine

## 2023-07-02 ENCOUNTER — Other Ambulatory Visit: Payer: Self-pay | Admitting: Family Medicine

## 2023-07-02 DIAGNOSIS — S83411A Sprain of medial collateral ligament of right knee, initial encounter: Secondary | ICD-10-CM

## 2023-11-21 ENCOUNTER — Telehealth: Admitting: Physician Assistant

## 2023-11-21 DIAGNOSIS — R531 Weakness: Secondary | ICD-10-CM

## 2023-11-21 DIAGNOSIS — R6889 Other general symptoms and signs: Secondary | ICD-10-CM

## 2023-11-21 NOTE — Progress Notes (Signed)
  Because of the persistent migraine for 3 days, chest/abdominal pain and generalized weakness, I feel your condition warrants further evaluation and I recommend that you be seen in a face-to-face visit.   NOTE: There will be NO CHARGE for this E-Visit   If you are having a true medical emergency, please call 911.     For an urgent face to face visit, [facility] has multiple urgent care centers for your convenience.  Click the link below for the full list of locations and hours, walk-in wait times, appointment scheduling options and driving directions:  Urgent Care - [place], [place], [place], [place], [place], [place]  [place]     Your MyChart E-visit questionnaire answers were reviewed by a board certified advanced clinical practitioner to complete your personal care plan based on your specific symptoms.    Thank you for using e-Visits.

## 2023-11-21 NOTE — Progress Notes (Signed)
 I have spent 5 minutes in review of e-visit questionnaire, review and updating patient chart, medical decision making and response to patient.   Shawnda Mauney, PA-C

## 2023-11-24 ENCOUNTER — Telehealth: Admitting: Nurse Practitioner

## 2023-11-24 DIAGNOSIS — L989 Disorder of the skin and subcutaneous tissue, unspecified: Secondary | ICD-10-CM

## 2023-11-24 MED ORDER — HYDROCORTISONE 2.5 % EX CREA
TOPICAL_CREAM | Freq: Two times a day (BID) | CUTANEOUS | 0 refills | Status: DC
Start: 2023-11-24 — End: 2023-12-26

## 2023-11-24 NOTE — Progress Notes (Signed)
   We are sorry that you are not feeling well. Here is how we plan to help!  I am prescribing Hydrocortisone  2.5% prescription cream -- apply to the affected area(s) in a thin layer, twice daily for up to 14 days. Do not apply to face, privates or armpit regions.   If no improvement in a few days you will need to be seen in person for a face to face evaluation at an urgent care.     HOME CARE:  Take cool showers and avoid direct sunlight. Apply cool compress or wet dressings. Take a bath in an oatmeal bath.  Sprinkle content of one Aveeno packet under running faucet with comfortably warm water.  Bathe for 15-20 minutes, 1-2 times daily.  Pat dry with a towel. Do not rub the rash. Use hydrocortisone  cream. Take an antihistamine like Benadryl  for widespread rashes that itch.  The adult dose of Benadryl  is 25-50 mg by mouth 4 times daily. Caution:  This type of medication may cause sleepiness.  Do not drink alcohol, drive, or operate dangerous machinery while taking antihistamines.  Do not take these medications if you have prostate enlargement.  Read package instructions thoroughly on all medications that you take.  GET HELP RIGHT AWAY IF:  Symptoms don't go away after treatment. Severe itching that persists. If you rash spreads or swells. If you rash begins to smell. If it blisters and opens or develops a yellow-brown crust. You develop a fever. You have a sore throat. You become short of breath.  MAKE SURE YOU:  Understand these instructions. Will watch your condition. Will get help right away if you are not doing well or get worse.  Thank you for choosing an e-visit.  Your e-visit answers were reviewed by a board certified advanced clinical practitioner to complete your personal care plan. Depending upon the condition, your plan could have included both over the counter or prescription medications.  Please review your pharmacy choice. Make sure the pharmacy is open so you can  pick up prescription now. If there is a problem, you may contact your provider through [employer] and have the prescription routed to another pharmacy.  Your safety is important to us . If you have drug allergies check your prescription carefully.   For the next 24 hours you can use MyChart to ask questions about today's visit, request a non-urgent call back, or ask for a work or school excuse. You will get an email in the next two days asking about your experience. I hope that your e-visit has been valuable and will speed your recovery.

## 2023-11-24 NOTE — Progress Notes (Signed)
 I have spent 5 minutes in review of e-visit questionnaire, review and updating patient chart, medical decision making and response to patient.   Claiborne Rigg, NP

## 2023-11-25 ENCOUNTER — Encounter (HOSPITAL_COMMUNITY): Payer: Self-pay

## 2023-11-25 ENCOUNTER — Emergency Department (HOSPITAL_COMMUNITY)

## 2023-11-25 ENCOUNTER — Other Ambulatory Visit: Payer: Self-pay

## 2023-11-25 ENCOUNTER — Emergency Department (HOSPITAL_COMMUNITY)
Admission: EM | Admit: 2023-11-25 | Discharge: 2023-11-25 | Disposition: A | Discharge status: Home / Self Care | Attending: Emergency Medicine | Admitting: Emergency Medicine

## 2023-11-25 DIAGNOSIS — G43809 Other migraine, not intractable, without status migrainosus: Secondary | ICD-10-CM | POA: Insufficient documentation

## 2023-11-25 DIAGNOSIS — M542 Cervicalgia: Secondary | ICD-10-CM | POA: Insufficient documentation

## 2023-11-25 DIAGNOSIS — R519 Headache, unspecified: Secondary | ICD-10-CM | POA: Diagnosis present

## 2023-11-25 LAB — BASIC METABOLIC PANEL WITH GFR
Anion gap: 9 (ref 5–15)
BUN: 11 mg/dL (ref 6–20)
CO2: 25 mmol/L (ref 22–32)
Calcium: 9.2 mg/dL (ref 8.9–10.3)
Chloride: 105 mmol/L (ref 98–111)
Creatinine, Ser: 0.89 mg/dL (ref 0.44–1.00)
GFR, Estimated: >60 mL/min (ref >60)
Glucose, Bld: 87 mg/dL (ref 70–99)
Potassium: 4.1 mmol/L (ref 3.5–5.1)
Sodium: 139 mmol/L (ref 135–145)

## 2023-11-25 LAB — CBC
HCT: 43.3 % (ref 36.0–46.0)
Hemoglobin: 13.8 g/dL (ref 12.0–15.0)
MCH: 30.1 pg (ref 26.0–34.0)
MCHC: 31.9 g/dL (ref 30.0–36.0)
MCV: 94.3 fL (ref 80.0–100.0)
Platelets: 317 K/uL (ref 150–400)
RBC: 4.59 MIL/uL (ref 3.87–5.11)
RDW: 11.8 % (ref 11.5–15.5)
WBC: 6.4 K/uL (ref 4.0–10.5)
nRBC: 0 % (ref 0.0–0.2)

## 2023-11-25 LAB — HCG, SERUM, QUALITATIVE: Preg, Serum: NEGATIVE

## 2023-11-25 MED ORDER — CYCLOBENZAPRINE HCL 10 MG PO TABS: 10.0000 mg | ORAL_TABLET | Freq: Two times a day (BID) | ORAL | 0 refills | Status: DC | PRN

## 2023-11-25 MED ORDER — KETOROLAC TROMETHAMINE 30 MG/ML IJ SOLN
30.0000 mg | Freq: Once | INTRAMUSCULAR | Status: AC
  Administered 2023-11-25: 30 mg via INTRAVENOUS
  Filled 2023-11-25: qty 1

## 2023-11-25 MED ORDER — NAPROXEN 375 MG PO TABS: 375.0000 mg | ORAL_TABLET | Freq: Two times a day (BID) | ORAL | 0 refills | Status: DC

## 2023-11-25 MED ORDER — METOCLOPRAMIDE HCL 5 MG/ML IJ SOLN
10.0000 mg | Freq: Once | INTRAMUSCULAR | Status: AC
  Administered 2023-11-25: 10 mg via INTRAVENOUS
  Filled 2023-11-25: qty 2

## 2023-11-25 NOTE — ED Notes (Signed)
 Pt. Back from CT.

## 2023-11-25 NOTE — ED Triage Notes (Signed)
 Patient is here with complaints of a migraine and neck pain for the past 3 days. Patient is also complaining of dizziness with the migraine. Denies nausea or vomiting.

## 2023-11-25 NOTE — ED Provider Notes (Signed)
  EMERGENCY DEPARTMENT AT [facility] Provider Note   CSN: [id number] Arrival date & time: 11/25/23  1533     Patient presents with: No chief complaint on file.   April Osborn is a 33 y.o. female.   HPI   Patient has history of anxiety and polycystic ovarian syndrome.  Patient states she has been having a headache ongoing for several days almost a week.  Patient headache is in the back of her head and upper neck.  She has had some photophobia.  She feels generally weak but no focal numbness or weakness.  No fevers.  Patient states she normally does not get headaches.  She has not taken any medications for this.  Prior to Admission medications   Medication Sig Start Date End Date Taking? Authorizing Provider  cyclobenzaprine  (FLEXERIL ) 10 MG tablet Take 1 tablet (10 mg total) by mouth 2 (two) times daily as needed for muscle spasms. 11/25/23  Yes Randol Simmonds, MD  naproxen  (NAPROSYN ) 375 MG tablet Take 1 tablet (375 mg total) by mouth 2 (two) times daily. 11/25/23  Yes Randol Simmonds, MD  hydrocortisone  2.5 % cream Apply topically 2 (two) times daily. 11/24/23   Theotis Haze ORN, NP  WEGOVY 1 MG/0.5ML SOAJ SQ injection  08/15/23   [provider]    Allergies: Oxy ir [oxycodone ]    Review of Systems  Updated Vital Signs BP 119/82   Pulse 80   Temp 98.1 F (36.7 C)   Resp 14   Ht 1.727 m (5' 8)   Wt 88.9 kg   LMP 11/18/2023 (Approximate)   SpO2 99%   Breastfeeding No   BMI 29.80 kg/m   Physical Exam Vitals and nursing note reviewed.  Constitutional:      Appearance: She is well-developed. She is not diaphoretic.  HENT:     Head: Normocephalic and atraumatic.     Right Ear: External ear normal.     Left Ear: External ear normal.  Eyes:     General: No scleral icterus.       Right eye: No discharge.        Left eye: No discharge.     Conjunctiva/sclera: Conjunctivae normal.  Neck:     Trachea: No tracheal deviation.     Comments:  Tenderness palpation paraspinal me also proximal cervical spine Cardiovascular:     Rate and Rhythm: Normal rate and regular rhythm.  Pulmonary:     Effort: Pulmonary effort is normal. No respiratory distress.     Breath sounds: Normal breath sounds. No stridor. No wheezing or rales.  Abdominal:     General: Bowel sounds are normal. There is no distension.     Palpations: Abdomen is soft.     Tenderness: There is no abdominal tenderness. There is no guarding or rebound.  Musculoskeletal:        General: No tenderness or deformity.     Cervical back: Neck supple. No rigidity.  Lymphadenopathy:     Cervical: No cervical adenopathy.  Skin:    General: Skin is warm and dry.     Findings: No rash.  Neurological:     General: No focal deficit present.     Mental Status: She is alert.     Cranial Nerves: No cranial nerve deficit, dysarthria or facial asymmetry.     Sensory: No sensory deficit.     Motor: No abnormal muscle tone or seizure activity.     Coordination: Coordination normal.  Psychiatric:  Mood and Affect: Mood normal.     (all labs ordered are listed, but only abnormal results are displayed) Labs Reviewed  CBC  BASIC METABOLIC PANEL WITH GFR  HCG, SERUM, QUALITATIVE    EKG: EKG Interpretation Date/Time:  Monday November 25 2023 16:00:48 EDT Ventricular Rate:  84 PR Interval:  170 QRS Duration:  74 QT Interval:  344 QTC Calculation: 406 R Axis:   55  Text Interpretation: Normal sinus rhythm Normal ECG When compared with ECG of 24-Oct-2022 13:15, No significant change since last tracing Confirmed by Randol Simmonds [phone]) on 11/25/2023 4:47:22 PM  Radiology: CT Head Wo Contrast Result Date: 11/25/2023 CLINICAL DATA:  Migraine headache and neck pain for 3 days, dizziness EXAM: CT HEAD WITHOUT CONTRAST TECHNIQUE: Contiguous axial images were obtained from the base of the skull through the vertex without intravenous contrast. RADIATION DOSE REDUCTION: This exam was  performed according to the departmental dose-optimization program which includes automated exposure control, adjustment of the mA and/or kV according to patient size and/or use of iterative reconstruction technique. COMPARISON:  None Available. FINDINGS: Brain: No acute infarct or hemorrhage. The lateral ventricles and midline structures are unremarkable. No acute extra-axial fluid collections. No mass effect. Vascular: No hyperdense vessel or unexpected calcification. Skull: Normal. Negative for fracture or focal lesion. Sinuses/Orbits: No acute finding. Other: None. IMPRESSION: 1. No acute intracranial process. Electronically Signed   By: Ozell Daring M.D.   On: 11/25/2023 17:20     Procedures   Medications Ordered in the ED  metoCLOPramide  (REGLAN ) injection 10 mg (10 mg Intravenous Given 11/25/23 1704)  ketorolac  (TORADOL ) 30 MG/ML injection 30 mg (30 mg Intravenous Given 11/25/23 1704)    Clinical Course as of 11/25/23 1911  Mon Nov 25, 2023  1729 Head CT without acute abnormalities.  CBC metabolic panel unremarkable [JK]    Clinical Course User Index [JK] Randol Simmonds, MD                                 Medical Decision Making Problems Addressed: Other migraine without status migrainosus, not intractable: acute illness or injury that poses a threat to life or bodily functions  Amount and/or Complexity of Data Reviewed Labs: ordered. Decision-making details documented in ED Course. Radiology: ordered and independent interpretation performed.  Risk Prescription drug management.   Patient presents with headache.  Low suspicion for meningitis.  She is not having any infection type symptoms.  Neck is supple.  No fever or leukocytosis.  CT scan does not show any signs of hemorrhage or other acute abnormality.  Symptoms could be related to migraine headache.  Some type of cervical impingement syndrome is also a possibility as well as tension type headache.  Symptoms improved with  treatment in the emergency room.  Evaluation and diagnostic testing in the emergency department does not suggest an emergent condition requiring admission or immediate intervention beyond what has been performed at this time.  The patient is safe for discharge and has been instructed to return immediately for worsening symptoms, change in symptoms or any other concerns.     Final diagnoses:  Other migraine without status migrainosus, not intractable    ED Discharge Orders          Ordered    naproxen  (NAPROSYN ) 375 MG tablet  2 times daily        11/25/23 1903    cyclobenzaprine  (FLEXERIL ) 10 MG tablet  2 times  daily PRN        11/25/23 1911               Randol Simmonds, MD 11/25/23 2337

## 2023-11-25 NOTE — ED Notes (Signed)
 Patient transported to CT

## 2023-11-25 NOTE — Discharge Instructions (Addendum)
 Take the medications as scribed to help with your headache.  The CT scan and blood test fortunately did not show any signs of acute abnormality.  Follow-up with your primary care doctor to be rechecked if the symptoms persist.

## 2023-11-25 NOTE — ED Triage Notes (Addendum)
 In addition to initial ED triage note, the patient repots she does not have a history of migraines and she has not taken any OTC pain medications for her headache and neck pain.

## 2023-12-26 ENCOUNTER — Telehealth: Admitting: Physician Assistant

## 2023-12-26 DIAGNOSIS — R3989 Other symptoms and signs involving the genitourinary system: Secondary | ICD-10-CM

## 2023-12-26 MED ORDER — CEPHALEXIN 500 MG PO CAPS: 500.0000 mg | ORAL_CAPSULE | Freq: Two times a day (BID) | ORAL | 0 refills | Status: AC

## 2023-12-26 NOTE — Progress Notes (Signed)
 I have spent 5 minutes in review of e-visit questionnaire, review and updating patient chart, medical decision making and response to patient.   Elsie Velma Lunger, PA-C

## 2023-12-26 NOTE — Progress Notes (Signed)

## 2024-02-15 ENCOUNTER — Telehealth: Admitting: Physician Assistant

## 2024-02-15 DIAGNOSIS — J029 Acute pharyngitis, unspecified: Secondary | ICD-10-CM

## 2024-02-16 MED ORDER — LIDOCAINE VISCOUS HCL 2 % MT SOLN: OROMUCOSAL | 0 refills | Status: AC

## 2024-02-16 NOTE — Progress Notes (Signed)
 We are sorry that you are not feeling well.  Here is how we plan to help!  Your symptoms indicate a likely viral infection (Pharyngitis).   Pharyngitis is inflammation in the back of the throat which can cause a sore throat, scratchiness and sometimes difficulty swallowing.   Pharyngitis is typically caused by a respiratory virus and will just run its course.  Please keep in mind that your symptoms could last up to 10 days.    For throat pain, we recommend over the counter oral pain relief medications such as acetaminophen  or aspirin, or anti-inflammatory medications such as ibuprofen  or naproxen sodium.  Topical treatments such as oral throat lozenges or sprays may be used as needed. For throat pain, I have prescribed a Viscous Lidocaine  2% solution. Swallow 5-10 mL every 4-6 hours as needed for sore throat. DO NOT eat or drink anything for 15-20 minutes after swallowing to allow the medication to coat the throat.SABRA  Avoid close contact with loved ones, especially the very young and elderly.  Remember to wash your hands thoroughly throughout the day as this is the number one way to prevent the spread of infection! We also recommend that you periodically wipe down door knobs and counters with disinfectant.  After careful review of your answers, I would not recommend and antibiotic for your condition.  Antibiotics should not be used to treat conditions that we suspect are caused by viruses like the virus that causes the common cold or flu.  Providers prescribe antibiotics to treat infections caused by bacteria. Antibiotics are very powerful in treating bacterial infections when they are used properly.  To maintain their effectiveness, they should be used only when necessary.  Overuse of antibiotics has resulted in the development of super bugs that are resistant to treatment!    Some people with strep throat, however, can have atypical symptoms. As such, if anything continued to progress despite treatment  recommendations, you may need formal testing in clinic or office.  Home Care: Only take medications as instructed by your medical team. Do not drink alcohol while taking these medications. A steam or ultrasonic humidifier can help congestion.  You can place a towel over your head and breathe in the steam from hot water coming from a faucet. Avoid close contacts especially the very young and the elderly. Cover your mouth when you cough or sneeze. Always remember to wash your hands.  Get Help Right Away If: You develop worsening fever or throat pain. You develop a severe head ache or visual changes. Your symptoms persist after you have completed your treatment plan.  Make sure you Understand these instructions. Will watch your condition. Will get help right away if you are not doing well or get worse.  Your e-visit answers were reviewed by a board certified advanced clinical practitioner to complete your personal care plan.  Depending on the condition, your plan could have included both over the counter or prescription medications.  If there is a problem please reply once you have received a response from your provider.  Your safety is important to us .  If you have drug allergies check your prescription carefully.    You can use MyChart to ask questions about todays visit, request a non-urgent call back, or ask for a work or school excuse for 24 hours related to this e-Visit. If it has been greater than 24 hours you will need to follow up with your provider, or enter a new e-Visit to address those concerns.  You will get an e-mail in the next two days asking about your experience.  I hope that your e-visit has been valuable and will speed your recovery. Thank you for using e-visits.   I have spent 5 minutes in review of e-visit questionnaire, review and updating patient chart, medical decision making and response to patient.   Teena Shuck, PA-C
